# Patient Record
Sex: Female | Born: 1965
Health system: Southern US, Community
[De-identification: ages and names within clinical notes are randomized; demographics above are authoritative.]

## PROBLEM LIST (undated history)

## (undated) DIAGNOSIS — F419 Anxiety disorder, unspecified: Secondary | ICD-10-CM

## (undated) DIAGNOSIS — G43909 Migraine, unspecified, not intractable, without status migrainosus: Secondary | ICD-10-CM

## (undated) DIAGNOSIS — F32A Depression, unspecified: Secondary | ICD-10-CM

## (undated) DIAGNOSIS — F329 Major depressive disorder, single episode, unspecified: Secondary | ICD-10-CM

## (undated) HISTORY — DX: Migraine, unspecified, not intractable, without status migrainosus: G43.909

## (undated) HISTORY — PX: NO PAST SURGERIES: SHX2092

## (undated) HISTORY — DX: Major depressive disorder, single episode, unspecified: F32.9

## (undated) HISTORY — DX: Depression, unspecified: F32.A

## (undated) HISTORY — DX: Anxiety disorder, unspecified: F41.9

---

## 1998-02-11 ENCOUNTER — Ambulatory Visit (HOSPITAL_COMMUNITY): Admission: RE | Admit: 1998-02-11 | Discharge: 1998-02-11 | Payer: Self-pay | Admitting: Obstetrics and Gynecology

## 1998-03-24 ENCOUNTER — Inpatient Hospital Stay (HOSPITAL_COMMUNITY): Admission: AD | Admit: 1998-03-24 | Discharge: 1998-03-27 | Payer: Self-pay | Admitting: Obstetrics & Gynecology

## 1998-04-09 ENCOUNTER — Inpatient Hospital Stay (HOSPITAL_COMMUNITY): Admission: AD | Admit: 1998-04-09 | Discharge: 1998-04-12 | Payer: Self-pay | Admitting: Obstetrics and Gynecology

## 1998-05-21 ENCOUNTER — Ambulatory Visit (HOSPITAL_COMMUNITY): Admission: RE | Admit: 1998-05-21 | Discharge: 1998-05-21 | Payer: Self-pay | Admitting: Obstetrics and Gynecology

## 1998-06-03 ENCOUNTER — Other Ambulatory Visit: Admission: RE | Admit: 1998-06-03 | Discharge: 1998-06-03 | Payer: Self-pay | Admitting: Obstetrics and Gynecology

## 1999-08-04 ENCOUNTER — Other Ambulatory Visit: Admission: RE | Admit: 1999-08-04 | Discharge: 1999-08-04 | Payer: Self-pay | Admitting: Obstetrics and Gynecology

## 2010-09-11 ENCOUNTER — Encounter: Payer: Self-pay | Admitting: Obstetrics and Gynecology

## 2016-10-09 DIAGNOSIS — E559 Vitamin D deficiency, unspecified: Secondary | ICD-10-CM | POA: Diagnosis not present

## 2016-10-09 DIAGNOSIS — Z1231 Encounter for screening mammogram for malignant neoplasm of breast: Secondary | ICD-10-CM | POA: Diagnosis not present

## 2016-10-09 DIAGNOSIS — G43009 Migraine without aura, not intractable, without status migrainosus: Secondary | ICD-10-CM | POA: Diagnosis not present

## 2016-11-21 DIAGNOSIS — L659 Nonscarring hair loss, unspecified: Secondary | ICD-10-CM | POA: Diagnosis not present

## 2016-11-21 DIAGNOSIS — N951 Menopausal and female climacteric states: Secondary | ICD-10-CM | POA: Diagnosis not present

## 2016-11-21 DIAGNOSIS — R5383 Other fatigue: Secondary | ICD-10-CM | POA: Diagnosis not present

## 2017-02-16 DIAGNOSIS — Z79899 Other long term (current) drug therapy: Secondary | ICD-10-CM | POA: Diagnosis not present

## 2017-02-16 DIAGNOSIS — N183 Chronic kidney disease, stage 3 (moderate): Secondary | ICD-10-CM | POA: Diagnosis not present

## 2017-02-16 DIAGNOSIS — E559 Vitamin D deficiency, unspecified: Secondary | ICD-10-CM | POA: Diagnosis not present

## 2017-02-16 DIAGNOSIS — E7800 Pure hypercholesterolemia, unspecified: Secondary | ICD-10-CM | POA: Diagnosis not present

## 2017-02-16 DIAGNOSIS — G43009 Migraine without aura, not intractable, without status migrainosus: Secondary | ICD-10-CM | POA: Diagnosis not present

## 2017-07-20 DIAGNOSIS — R0602 Shortness of breath: Secondary | ICD-10-CM | POA: Diagnosis not present

## 2017-07-20 DIAGNOSIS — Z0000 Encounter for general adult medical examination without abnormal findings: Secondary | ICD-10-CM | POA: Diagnosis not present

## 2017-07-20 DIAGNOSIS — N183 Chronic kidney disease, stage 3 (moderate): Secondary | ICD-10-CM | POA: Diagnosis not present

## 2017-07-20 DIAGNOSIS — M129 Arthropathy, unspecified: Secondary | ICD-10-CM | POA: Diagnosis not present

## 2017-11-19 ENCOUNTER — Encounter: Payer: Self-pay | Admitting: Family Medicine

## 2017-11-19 ENCOUNTER — Telehealth: Payer: Self-pay | Admitting: Family Medicine

## 2017-11-19 ENCOUNTER — Ambulatory Visit (INDEPENDENT_AMBULATORY_CARE_PROVIDER_SITE_OTHER): Payer: 59 | Admitting: Family Medicine

## 2017-11-19 VITALS — BP 112/82 | HR 89 | Temp 98.3°F | Ht 67.0 in | Wt 206.2 lb

## 2017-11-19 DIAGNOSIS — G43009 Migraine without aura, not intractable, without status migrainosus: Secondary | ICD-10-CM | POA: Diagnosis not present

## 2017-11-19 MED ORDER — ATORVASTATIN CALCIUM 40 MG PO TABS: 40.0000 mg | ORAL_TABLET | Freq: Every day | ORAL | 3 refills | Status: DC

## 2017-11-19 MED ORDER — DIVALPROEX SODIUM ER 250 MG PO TB24: 250.0000 mg | ORAL_TABLET | Freq: Two times a day (BID) | ORAL | 3 refills | Status: DC

## 2017-11-19 MED ORDER — SUMATRIPTAN SUCCINATE 100 MG PO TABS: 100.0000 mg | ORAL_TABLET | ORAL | 5 refills | Status: DC | PRN

## 2017-11-19 NOTE — Patient Instructions (Addendum)
Let me know if you need refills.   Let us know if you need anything.  

## 2017-11-19 NOTE — Telephone Encounter (Signed)
Copied from CRM [phone]. Topic: Quick Communication - See Telephone Encounter >> Nov 19, 2017  3:38 PM Landry MellowFoltz, Melissa J wrote: CRM for notification. See Telephone encounter for: 11/19/17. Jane from pharmacy called  divalproex (DEPAKOTE ER) 250 MG 24 hr tablet - wants to make sure that is dosed correctly - the Er is usally dosed 1 time a day.  The DR is usually twice  a day  Cb nu is [phone]

## 2017-11-19 NOTE — Progress Notes (Signed)
Chief Complaint  Patient presents with  . Establish Care       New Patient Visit SUBJECTIVE: HPI: Cindy Conway is an 52 y.o.female who is being seen for establishing care.  The patient was previously seen at [facility].  Hx of migraines, currently on Depakote 250 mg bid. Has failed Topamax. Will use <10x/mo. Does not currently have a headache. Migraines affect R side. No aura. Associated symptoms include rare N/V.  Denies vision changes, light/sound sensitivity, balance issues, weakness, current headache.   Allergies  Allergen Reactions  . Sulfa Antibiotics Rash    Past Medical History:  Diagnosis Date  . Anxiety   . Depression   . Migraines    History reviewed. No pertinent surgical history. Social History   Socioeconomic History  . Marital status: Single   Family History  Problem Relation Age of Onset  . Heart disease Mother   . Lupus Mother   . Heart disease Father      Current Outpatient Medications:  .  atorvastatin (LIPITOR) 40 MG tablet, Take 1 tablet (40 mg total) by mouth daily., Disp: 90 tablet, Rfl: 3 .  divalproex (DEPAKOTE ER) 250 MG 24 hr tablet, Take 1 tablet (250 mg total) by mouth 2 (two) times daily., Disp: 180 tablet, Rfl: 3 .  FLUoxetine (PROZAC) 40 MG capsule, Take 40 mg by mouth daily., Disp: , Rfl:  .  gabapentin (NEURONTIN) 600 MG tablet, Take 600 mg by mouth 3 (three) times daily., Disp: , Rfl:  .  mirtazapine (REMERON) 15 MG tablet, Take 15 mg by mouth at bedtime as needed., Disp: , Rfl:  .  Multiple Vitamin (MULTIVITAMIN) tablet, Take 1 tablet by mouth daily., Disp: , Rfl:  .  pramipexole (MIRAPEX) 0.5 MG tablet, Take 0.5 mg by mouth 3 (three) times daily., Disp: , Rfl:  .  SUMAtriptan (IMITREX) 100 MG tablet, Take 1 tablet (100 mg total) by mouth every 2 (two) hours as needed for migraine. May repeat in 2 hours if headache persists or recurs., Disp: 10 tablet, Rfl: 5 .  traZODone (DESYREL) 50 MG tablet, Take 50 mg by mouth at bedtime as  needed for sleep., Disp: , Rfl:  .  Vitamin D, Ergocalciferol, (DRISDOL) 50000 units CAPS capsule, Take 50,000 Units by mouth every 7 (seven) days., Disp: , Rfl:   No LMP recorded.  ROS Cardiovascular: Denies chest pain  Respiratory: Denies dyspnea   OBJECTIVE: BP 112/82 (BP Location: Left Arm, Patient Position: Sitting, Cuff Size: Large)   Pulse 89   Temp 98.3 F (36.8 C) (Oral)   Ht 5\' 7"  (1.702 m)   Wt 206 lb 4 oz (93.6 kg)   SpO2 96%   BMI 32.30 kg/m   Constitutional: -  VS reviewed -  Well developed, well nourished, appears stated age -  No apparent distress  Psychiatric: -  Oriented to person, place, and time -  Memory intact -  Affect and mood normal -  Fluent conversation, good eye contact -  Judgment and insight age appropriate  Eye: -  Conjunctivae clear, no discharge -  Pupils symmetric, round, reactive to light  ENMT: -  MMM    Pharynx moist, no exudate, no erythema  Neck: -  No gross swelling, no palpable masses -  Thyroid midline, not enlarged, mobile, no palpable masses  Cardiovascular: -  RRR -  No LE edema  Respiratory: -  Normal respiratory effort, no accessory muscle use, no retraction -  Breath sounds equal, no wheezes, no ronchi,  no crackles  Gastrointestinal: -  Bowel sounds normal -  No tenderness, no distention, no guarding, no masses  Neurological:  -  CN II - XII grossly intact -  Sensation grossly intact to light touch, equal bilaterally  Musculoskeletal: -  No clubbing, no cyanosis -  Gait normal  Skin: -  No significant lesion on inspection -  Warm and dry to palpation   ASSESSMENT/PLAN: Migraine without aura and without status migrainosus, not intractable - Plan: SUMAtriptan (IMITREX) 100 MG tablet, divalproex (DEPAKOTE ER) 250 MG 24 hr tablet  Patient instructed to sign release of records form from her previous PCP. Cont Depakote, would like to see some relatively recent liver function testing. Will await records. Patient should  return in 6 mo for CPE. The patient voiced understanding and agreement to the plan.   Jilda Roche [place], DO 11/19/17  3:35 PM

## 2017-11-19 NOTE — Progress Notes (Signed)
Pre visit review using our clinic review tool, if applicable. No additional management support is needed unless otherwise documented below in the visit note. 

## 2017-11-20 NOTE — Telephone Encounter (Signed)
Patient called us back and she clarifies she has been on this medication and she takes it bid.

## 2017-11-21 ENCOUNTER — Other Ambulatory Visit: Payer: Self-pay | Admitting: Family Medicine

## 2017-11-21 ENCOUNTER — Encounter: Payer: Self-pay | Admitting: Family Medicine

## 2017-11-21 MED ORDER — DIVALPROEX SODIUM 250 MG PO DR TAB: 250.0000 mg | DELAYED_RELEASE_TABLET | Freq: Two times a day (BID) | ORAL | 1 refills | Status: DC

## 2017-11-21 NOTE — Telephone Encounter (Signed)
Is correct as she stated on the list.

## 2017-11-28 MED FILL — SUMATRIPTAN SUCC 100 MG TAB: 100 | 4 days supply | Qty: 9 | Fill #0

## 2017-11-28 MED FILL — ATORVASTATIN 40 MG TABLET: 40 | 30 days supply | Qty: 30 | Fill #0

## 2017-11-28 MED FILL — DIVALPROEX SOD DR 250 MG TA: 250 | 30 days supply | Qty: 60 | Fill #0

## 2017-12-28 ENCOUNTER — Encounter: Payer: Self-pay | Admitting: Family Medicine

## 2018-01-03 MED FILL — DIVALPROEX SOD DR 250 MG TA: 250 | 7 days supply | Qty: 15 | Fill #1

## 2018-01-03 MED FILL — ATORVASTATIN 40 MG TABLET: 40 | 30 days supply | Qty: 30 | Fill #1

## 2018-01-03 MED FILL — SUMATRIPTAN SUCC 100 MG TAB: 100 | 4 days supply | Qty: 9 | Fill #1

## 2018-01-04 MED FILL — ARIPiprazole 5 MG TABS: 5 | 30 days supply | Qty: 27 | Fill #0

## 2018-01-04 MED FILL — FLUoxetine HCL 40 MG CAPS: 40 | 30 days supply | Qty: 30 | Fill #0

## 2018-01-04 MED FILL — GABAPENTIN 600 MG TABS: 600 | 30 days supply | Qty: 90 | Fill #0

## 2018-01-04 MED FILL — PRAMIPEXOLE 1.5 MG TABLET: 1.5 | 30 days supply | Qty: 30 | Fill #0

## 2018-01-09 MED FILL — DIVALPROEX SOD DR 250 MG TA: 250 | 30 days supply | Qty: 60 | Fill #2

## 2018-01-22 MED FILL — SUMATRIPTAN SUCC 100 MG TAB: 100 | 5 days supply | Qty: 9 | Fill #2

## 2018-01-31 MED FILL — ATORVASTATIN 40 MG TABLET: 40 | 30 days supply | Qty: 30 | Fill #2

## 2018-01-31 MED FILL — FLUoxetine HCL 40 MG CAPS: 40 | 30 days supply | Qty: 30 | Fill #1

## 2018-01-31 MED FILL — PRAMIPEXOLE 1.5 MG TABLET: 1.5 | 30 days supply | Qty: 30 | Fill #1

## 2018-01-31 MED FILL — SUMATRIPTAN SUCC 100 MG TAB: 100 | 5 days supply | Qty: 9 | Fill #3

## 2018-01-31 MED FILL — GABAPENTIN 600 MG TAB: 600 | 30 days supply | Qty: 90 | Fill #1

## 2018-01-31 MED FILL — ARIPiprazole 5 MG TABS: 5 | 30 days supply | Qty: 30 | Fill #0

## 2018-02-01 MED FILL — DIVALPROEX SOD DR 250 MG TA: 250 | 30 days supply | Qty: 60 | Fill #3

## 2018-02-08 MED FILL — DIVALPROEX SOD ER 250 MG TA: 250 | 30 days supply | Qty: 60 | Fill #0

## 2018-02-08 MED FILL — SUMATRIPTAN SUCC 100 MG TAB: 100 | 5 days supply | Qty: 9 | Fill #4

## 2018-02-12 MED FILL — LITHIUM CARBONATE ER 300 MG: 300 | 31 days supply | Qty: 81 | Fill #0

## 2018-02-27 MED FILL — FLUoxetine HCL 40 MG CAPS: 40 | 30 days supply | Qty: 30 | Fill #0

## 2018-02-27 MED FILL — ARIPiprazole 5 MG TABS: 5 | 30 days supply | Qty: 30 | Fill #1

## 2018-02-27 MED FILL — PRAMIPEXOLE 1.5 MG TABLET: 1.5 | 30 days supply | Qty: 30 | Fill #0

## 2018-02-27 MED FILL — ATORVASTATIN 40 MG TABLET: 40 | 30 days supply | Qty: 30 | Fill #3

## 2018-02-27 MED FILL — DIVALPROEX SOD DR 250 MG TA: 250 | 5 days supply | Qty: 10 | Fill #4

## 2018-02-27 MED FILL — GABAPENTIN 600 MG TABS: 600 | 30 days supply | Qty: 90 | Fill #0

## 2018-03-08 MED FILL — SUMATRIPTAN SUCC 100 MG TAB: 100 | 5 days supply | Qty: 9 | Fill #5

## 2018-03-08 MED FILL — DIVALPROEX SOD DR 250 MG TA: 250 | 25 days supply | Qty: 50 | Fill #5

## 2018-03-25 MED FILL — lamoTRIgine 25 MG TABS: 25 | 30 days supply | Qty: 50 | Fill #0

## 2018-03-25 MED FILL — FLUoxetine HCL 40 MG CAPS: 40 | 30 days supply | Qty: 30 | Fill #0

## 2018-03-25 MED FILL — GABAPENTIN 600 MG TABS: 600 | 30 days supply | Qty: 90 | Fill #0

## 2018-03-25 MED FILL — PRAMIPEXOLE 1.5 MG TABLET: 1.5 | 30 days supply | Qty: 30 | Fill #0

## 2018-03-27 MED FILL — DIVALPROEX SOD DR 250 MG TA: 250 | 25 days supply | Qty: 50 | Fill #6

## 2018-03-27 MED FILL — SUMATRIPTAN SUCC 100 MG TAB: 100 | 3 days supply | Qty: 6 | Fill #6

## 2018-03-28 DIAGNOSIS — M722 Plantar fascial fibromatosis: Secondary | ICD-10-CM | POA: Diagnosis not present

## 2018-03-28 MED FILL — MELOXICAM 15 MG TABLET: 15 | 30 days supply | Qty: 30 | Fill #0

## 2018-04-01 ENCOUNTER — Other Ambulatory Visit: Payer: Self-pay | Admitting: Family Medicine

## 2018-04-01 DIAGNOSIS — G43009 Migraine without aura, not intractable, without status migrainosus: Secondary | ICD-10-CM

## 2018-04-02 MED FILL — SUMATRIPTAN SUCC 100 MG TAB: 100 | 30 days supply | Qty: 10 | Fill #0

## 2018-04-04 DIAGNOSIS — M722 Plantar fascial fibromatosis: Secondary | ICD-10-CM | POA: Diagnosis not present

## 2018-04-25 MED FILL — SUMATRIPTAN SUCC 100 MG TAB: 100 | 30 days supply | Qty: 10 | Fill #1

## 2018-04-29 MED FILL — PRAMIPEXOLE 1.5 MG TABLET: 1.5 | 30 days supply | Qty: 30 | Fill #1

## 2018-04-29 MED FILL — FLUoxetine HCL 40 MG CAPS: 40 | 30 days supply | Qty: 30 | Fill #1

## 2018-04-29 MED FILL — GABAPENTIN 600 MG TABS: 600 | 30 days supply | Qty: 90 | Fill #1

## 2018-05-02 MED FILL — DIVALPROEX SOD DR 250 MG TA: 250 | 25 days supply | Qty: 50 | Fill #7

## 2018-05-13 ENCOUNTER — Encounter: Payer: Self-pay | Admitting: Family Medicine

## 2018-05-21 MED FILL — MELOXICAM 15 MG TABLET: 15 | 30 days supply | Qty: 30 | Fill #1

## 2018-05-21 MED FILL — SUMATRIPTAN SUCC 100 MG TAB: 100 | 30 days supply | Qty: 10 | Fill #2

## 2018-05-22 MED FILL — FLUoxetine HCL 40 MG CAPS: 40 | 30 days supply | Qty: 30 | Fill #1

## 2018-05-22 MED FILL — PRAMIPEXOLE 1.5 MG TABLET: 1.5 | 30 days supply | Qty: 30 | Fill #1

## 2018-05-23 ENCOUNTER — Other Ambulatory Visit: Payer: Self-pay | Admitting: Family Medicine

## 2018-05-23 MED ORDER — DIVALPROEX SODIUM 250 MG PO DR TAB: 250.0000 mg | DELAYED_RELEASE_TABLET | Freq: Two times a day (BID) | ORAL | 1 refills | Status: DC

## 2018-05-23 MED FILL — DIVALPROEX SOD DR 250 MG TA: 250 | 30 days supply | Qty: 60 | Fill #0

## 2018-05-29 ENCOUNTER — Encounter: Payer: 59 | Admitting: Family Medicine

## 2018-06-17 MED FILL — SUMATRIPTAN SUCC 100 MG TAB: 100 | 30 days supply | Qty: 10 | Fill #3

## 2018-06-17 MED FILL — FLUoxetine HCL 40 MG CAPS: 40 | 30 days supply | Qty: 30 | Fill #0

## 2018-06-17 MED FILL — GABAPENTIN 600 MG TABS: 600 | 30 days supply | Qty: 90 | Fill #1

## 2018-06-17 MED FILL — PRAMIPEXOLE 1.5 MG TABLET: 1.5 | 30 days supply | Qty: 30 | Fill #2

## 2018-06-17 MED FILL — DIVALPROEX SOD DR 250 MG TA: 250 | 30 days supply | Qty: 60 | Fill #1

## 2018-06-24 ENCOUNTER — Ambulatory Visit (INDEPENDENT_AMBULATORY_CARE_PROVIDER_SITE_OTHER): Payer: 59 | Admitting: Family Medicine

## 2018-06-24 ENCOUNTER — Encounter: Payer: Self-pay | Admitting: Family Medicine

## 2018-06-24 VITALS — BP 108/72 | HR 99 | Temp 98.3°F | Ht 64.0 in | Wt 204.4 lb

## 2018-06-24 DIAGNOSIS — Z1239 Encounter for other screening for malignant neoplasm of breast: Secondary | ICD-10-CM | POA: Diagnosis not present

## 2018-06-24 DIAGNOSIS — Z114 Encounter for screening for human immunodeficiency virus [HIV]: Secondary | ICD-10-CM

## 2018-06-24 DIAGNOSIS — E559 Vitamin D deficiency, unspecified: Secondary | ICD-10-CM | POA: Diagnosis not present

## 2018-06-24 DIAGNOSIS — Z0000 Encounter for general adult medical examination without abnormal findings: Secondary | ICD-10-CM | POA: Diagnosis not present

## 2018-06-24 DIAGNOSIS — Z23 Encounter for immunization: Secondary | ICD-10-CM | POA: Diagnosis not present

## 2018-06-24 DIAGNOSIS — Z2821 Immunization not carried out because of patient refusal: Secondary | ICD-10-CM

## 2018-06-24 LAB — LIPID PANEL
Cholesterol: 252 mg/dL — ABNORMAL HIGH (ref 0–200)
HDL: 38.6 mg/dL — ABNORMAL LOW (ref >39.00)
NonHDL: 213.72
Total CHOL/HDL Ratio: 7
Triglycerides: 286 mg/dL — ABNORMAL HIGH (ref 0.0–149.0)
VLDL: 57.2 mg/dL — AB (ref 0.0–40.0)

## 2018-06-24 LAB — COMPREHENSIVE METABOLIC PANEL
ALK PHOS: 55 U/L (ref 39–117)
ALT: 15 U/L (ref 0–35)
AST: 21 U/L (ref 0–37)
Albumin: 4.4 g/dL (ref 3.5–5.2)
BILIRUBIN TOTAL: 0.3 mg/dL (ref 0.2–1.2)
BUN: 8 mg/dL (ref 6–23)
CALCIUM: 9.3 mg/dL (ref 8.4–10.5)
CO2: 26 meq/L (ref 19–32)
Chloride: 106 mEq/L (ref 96–112)
Creatinine, Ser: 0.73 mg/dL (ref 0.40–1.20)
GFR: 88.37 mL/min (ref >60.00)
Glucose, Bld: 95 mg/dL (ref 70–99)
POTASSIUM: 3.4 meq/L — AB (ref 3.5–5.1)
Sodium: 142 mEq/L (ref 135–145)
TOTAL PROTEIN: 7.3 g/dL (ref 6.0–8.3)

## 2018-06-24 LAB — LDL CHOLESTEROL, DIRECT: LDL DIRECT: 193 mg/dL

## 2018-06-24 LAB — VITAMIN D 25 HYDROXY (VIT D DEFICIENCY, FRACTURES): VITD: 26.48 ng/mL — AB (ref 30.00–100.00)

## 2018-06-24 MED ORDER — ATORVASTATIN CALCIUM 40 MG PO TABS: 40.0000 mg | ORAL_TABLET | Freq: Every day | ORAL | 3 refills | Status: DC

## 2018-06-24 MED ORDER — VITAMIN D (ERGOCALCIFEROL) 1.25 MG (50000 UNIT) PO CAPS: 50000.0000 [IU] | ORAL_CAPSULE | ORAL | 1 refills | Status: DC

## 2018-06-24 MED FILL — VIT D2 1.25 MG (50,000 UNIT: 1.25 MG | 28 days supply | Qty: 4 | Fill #0

## 2018-06-24 MED FILL — ATORVASTATIN 40 MG TABLET: 40 | 30 days supply | Qty: 30 | Fill #0

## 2018-06-24 NOTE — Progress Notes (Signed)
Chief Complaint  Patient presents with  . Annual Exam     Well Woman Cindy Conway is here for a complete physical.   Her last physical was >1 year ago.  Current diet: in general, diet could be better. Current exercise: none, active at work. Weight is stable. No LMP recorded. Seatbelt? Yes  Health Maintenance Pap/HPV- No Mammogram- No Tetanus- No HIV screening- No  Refuses flu shot.  Past Medical History:  Diagnosis Date  . Anxiety   . Depression   . Migraines     Past Surgical History:  Procedure Laterality Date  . NO PAST SURGERIES     Medications  Current Outpatient Medications on File Prior to Visit  Medication Sig Dispense Refill  . divalproex (DEPAKOTE) 250 MG DR tablet Take 1 tablet (250 mg total) by mouth 2 (two) times daily. 180 tablet 1  . FLUoxetine (PROZAC) 40 MG capsule Take 40 mg by mouth daily.    Marland Kitchen gabapentin (NEURONTIN) 600 MG tablet Take 600 mg by mouth 3 (three) times daily.    . meloxicam (MOBIC) 15 MG tablet Take 15 mg by mouth daily.    . Multiple Vitamin (MULTIVITAMIN) tablet Take 1 tablet by mouth daily.    . pramipexole (MIRAPEX) 0.5 MG tablet Take 0.5 mg by mouth daily.     . SUMAtriptan (IMITREX) 100 MG tablet TAKE 1 TABLET BY MOUTH EVERY TWO HOURS AS NEEDED FOR MIGRAINE. MAY REPEAT ONCE IN 2 HOURS IF HEADACHE PERSITS OR RECURS 10 tablet 5  . mirtazapine (REMERON) 15 MG tablet Take 15 mg by mouth at bedtime as needed.    . traZODone (DESYREL) 50 MG tablet Take 50 mg by mouth at bedtime as needed for sleep.     Allergies Allergies  Allergen Reactions  . Sulfa Antibiotics Rash    Review of Systems: Constitutional:  no unexpected weight changes Eye:  no recent significant change in vision Ear/Nose/Mouth/Throat:  Ears:  no tinnitus or vertigo and no recent change in hearing Nose/Mouth/Throat:  no complaints of nasal congestion, no sore throat Cardiovascular: no chest pain Respiratory:  no cough and no shortness of  breath Gastrointestinal:  no abdominal pain, no change in bowel habits GU:  Female: negative for dysuria or pelvic pain Musculoskeletal/Extremities:  no pain of the joints Integumentary (Skin/Breast):  no abnormal skin lesions reported Neurologic:  no headaches Endocrine:  denies fatigue Hematologic/Lymphatic:  No areas of easy bleeding  Exam BP 108/72 (BP Location: Left Arm, Patient Position: Sitting, Cuff Size: Normal)   Pulse 99   Temp 98.3 F (36.8 C) (Oral)   Ht 5\' 4"  (1.626 m)   Wt 204 lb 6 oz (92.7 kg)   SpO2 98%   BMI 35.08 kg/m  General:  well developed, well nourished, in no apparent distress Skin:  no significant moles, warts, or growths Head:  no masses, lesions, or tenderness Eyes:  pupils equal and round, sclera anicteric without injection Ears:  canals without lesions, TMs shiny without retraction, no obvious effusion, no erythema Nose:  nares patent, septum midline, mucosa normal, and no drainage or sinus tenderness Throat/Pharynx:  lips and gingiva without lesion; tongue and uvula midline; non-inflamed pharynx; no exudates or postnasal drainage Neck: neck supple without adenopathy, thyromegaly, or masses Lungs:  clear to auscultation, breath sounds equal bilaterally, no respiratory distress Cardio:  regular rate and rhythm, no bruits, no LE edema Abdomen:  abdomen soft, nontender; bowel sounds normal; no masses or organomegaly Genital: Defer to GYN Musculoskeletal:  symmetrical muscle  groups noted without atrophy or deformity Extremities:  no clubbing, cyanosis, or edema, no deformities, no skin discoloration Neuro:  gait normal; deep tendon reflexes normal and symmetric Psych: well oriented with normal range of affect and appropriate judgment/insight  Assessment and Plan  Well adult exam - Plan: Comprehensive metabolic panel, Lipid panel  Encounter for screening for HIV - Plan: HIV Antibody (routine testing w rflx)  Screening for breast cancer - Plan: MM  DIGITAL SCREENING BILATERAL  Vitamin D deficiency - Plan: Vitamin D (25 hydroxy)  Need for tetanus booster - Plan: Tdap vaccine greater than or equal to 7yo IM  Refused influenza vaccine   Well 52 y.o. female. Counseled on diet and exercise. Needs to exercise more and clean up diet.  GYN info provided.  Declines colonoscopy at this time, will let us know if she changes her mind. Would prefer this over Cologard.  Other orders as above. Follow up 6 mo for med ck. The patient voiced understanding and agreement to the plan.  Jilda Roche [place], DO 06/24/18 1:47 PM

## 2018-06-24 NOTE — Progress Notes (Signed)
Pre visit review using our clinic review tool, if applicable. No additional management support is needed unless otherwise documented below in the visit note. 

## 2018-06-24 NOTE — Patient Instructions (Addendum)
Give [place] 2-3 business days to get the results of your labs back.   Call Center for [facility] at [facility] at [phone] for an appointment.  They are located at [address], [address], [place], [place], [address] (right across the hall from our office).  The new Shingrix vaccine (for shingles) is a 2 shot series. It can make people feel low energy, achy and almost like they have the flu for 48 hours after injection. Please plan accordingly when deciding on when to get this shot. Call our office for a nurse visit appointment to get this. The second shot of the series is less severe regarding the side effects, but it still lasts 48 hours.   Aim to do some physical exertion for 150 minutes per week. This is typically divided into 5 days per week, 30 minutes per day. The activity should be enough to get your heart rate up. Anything is better than nothing if you have time constraints. Consider lifting weights to help upper body.  Let us know if you need anything.

## 2018-06-25 ENCOUNTER — Other Ambulatory Visit: Payer: Self-pay | Admitting: Family Medicine

## 2018-06-25 DIAGNOSIS — E559 Vitamin D deficiency, unspecified: Secondary | ICD-10-CM

## 2018-06-25 DIAGNOSIS — E876 Hypokalemia: Secondary | ICD-10-CM

## 2018-06-25 DIAGNOSIS — E782 Mixed hyperlipidemia: Secondary | ICD-10-CM

## 2018-06-25 LAB — HIV ANTIBODY (ROUTINE TESTING W REFLEX): HIV 1&2 Ab, 4th Generation: NONREACTIVE

## 2018-06-26 ENCOUNTER — Encounter (HOSPITAL_BASED_OUTPATIENT_CLINIC_OR_DEPARTMENT_OTHER): Payer: Self-pay

## 2018-06-26 ENCOUNTER — Ambulatory Visit (HOSPITAL_BASED_OUTPATIENT_CLINIC_OR_DEPARTMENT_OTHER)
Admission: RE | Admit: 2018-06-26 | Discharge: 2018-06-26 | Disposition: A | Discharge status: Home / Self Care | Payer: 59 | Source: Ambulatory Visit | Attending: Family Medicine | Admitting: Family Medicine

## 2018-06-26 ENCOUNTER — Inpatient Hospital Stay (HOSPITAL_BASED_OUTPATIENT_CLINIC_OR_DEPARTMENT_OTHER): Admission: RE | Admit: 2018-06-26 | Payer: 59 | Source: Ambulatory Visit

## 2018-06-26 DIAGNOSIS — Z1239 Encounter for other screening for malignant neoplasm of breast: Secondary | ICD-10-CM | POA: Insufficient documentation

## 2018-06-26 DIAGNOSIS — Z1231 Encounter for screening mammogram for malignant neoplasm of breast: Secondary | ICD-10-CM | POA: Diagnosis not present

## 2018-06-28 ENCOUNTER — Other Ambulatory Visit: Payer: Self-pay | Admitting: Family Medicine

## 2018-06-28 DIAGNOSIS — R921 Mammographic calcification found on diagnostic imaging of breast: Secondary | ICD-10-CM

## 2018-07-01 ENCOUNTER — Telehealth: Payer: Self-pay | Admitting: *Deleted

## 2018-07-01 NOTE — Telephone Encounter (Signed)
Received Physician Orders from [facility]; forwarded to provider/SLS 11/11

## 2018-07-02 ENCOUNTER — Other Ambulatory Visit: Payer: Self-pay

## 2018-07-02 ENCOUNTER — Other Ambulatory Visit (INDEPENDENT_AMBULATORY_CARE_PROVIDER_SITE_OTHER): Payer: 59

## 2018-07-02 ENCOUNTER — Telehealth: Payer: Self-pay

## 2018-07-02 ENCOUNTER — Other Ambulatory Visit: Payer: Self-pay | Admitting: Family Medicine

## 2018-07-02 DIAGNOSIS — R921 Mammographic calcification found on diagnostic imaging of breast: Secondary | ICD-10-CM

## 2018-07-02 DIAGNOSIS — E559 Vitamin D deficiency, unspecified: Secondary | ICD-10-CM

## 2018-07-02 DIAGNOSIS — E782 Mixed hyperlipidemia: Secondary | ICD-10-CM

## 2018-07-02 DIAGNOSIS — E876 Hypokalemia: Secondary | ICD-10-CM | POA: Diagnosis not present

## 2018-07-02 LAB — BASIC METABOLIC PANEL
BUN: 11 mg/dL (ref 6–23)
CALCIUM: 9.2 mg/dL (ref 8.4–10.5)
CO2: 25 mEq/L (ref 19–32)
CREATININE: 0.71 mg/dL (ref 0.40–1.20)
Chloride: 105 mEq/L (ref 96–112)
GFR: 91.24 mL/min (ref >60.00)
Glucose, Bld: 95 mg/dL (ref 70–99)
Potassium: 3.4 mEq/L — ABNORMAL LOW (ref 3.5–5.1)
Sodium: 141 mEq/L (ref 135–145)

## 2018-07-02 LAB — VITAMIN D 25 HYDROXY (VIT D DEFICIENCY, FRACTURES): VITD: 23.79 ng/mL — AB (ref 30.00–100.00)

## 2018-07-02 LAB — LDL CHOLESTEROL, DIRECT: LDL DIRECT: 212 mg/dL

## 2018-07-02 LAB — LIPID PANEL
Cholesterol: 269 mg/dL — ABNORMAL HIGH (ref 0–200)
HDL: 38.4 mg/dL — AB (ref >39.00)
NONHDL: 230.92
Total CHOL/HDL Ratio: 7
Triglycerides: 216 mg/dL — ABNORMAL HIGH (ref 0.0–149.0)
VLDL: 43.2 mg/dL — AB (ref 0.0–40.0)

## 2018-07-02 NOTE — Telephone Encounter (Signed)
Copied from CRM [id number]. Topic: Referral - Status >> Jul 02, 2018  2:02 PM Crist Infante wrote: Reason for CRM: [facility]kshire [facility] in order for diagnostic mammogram. Pt is scheduled tomorrow. Dr Can sign off in epic, or return the fax today please.

## 2018-07-03 ENCOUNTER — Other Ambulatory Visit: Payer: Self-pay | Admitting: Family Medicine

## 2018-07-03 MED ORDER — POTASSIUM CHLORIDE CRYS ER 10 MEQ PO TBCR: 10.0000 meq | EXTENDED_RELEASE_TABLET | Freq: Two times a day (BID) | ORAL | 0 refills | Status: DC

## 2018-07-03 MED FILL — POTASSIUM CHL ER M10 TABLET: 10 | 7 days supply | Qty: 14 | Fill #0

## 2018-07-03 NOTE — Telephone Encounter (Signed)
Received paperwork///faxed yesterday 07/02/2018

## 2018-07-04 NOTE — Telephone Encounter (Signed)
Orders faxed back to [facility] on 07/03/18 Am/SLS

## 2018-07-08 ENCOUNTER — Ambulatory Visit
Admission: RE | Admit: 2018-07-08 | Discharge: 2018-07-08 | Disposition: A | Discharge status: Home / Self Care | Payer: 59 | Source: Ambulatory Visit | Attending: Family Medicine | Admitting: Family Medicine

## 2018-07-08 DIAGNOSIS — R921 Mammographic calcification found on diagnostic imaging of breast: Secondary | ICD-10-CM

## 2018-07-08 DIAGNOSIS — R922 Inconclusive mammogram: Secondary | ICD-10-CM | POA: Diagnosis not present

## 2018-07-11 MED FILL — FLUoxetine HCL 40 MG CAPS: 40 | 30 days supply | Qty: 30 | Fill #1

## 2018-07-11 MED FILL — PRAMIPEXOLE 1.5 MG TABLET: 1.5 | 30 days supply | Qty: 30 | Fill #3

## 2018-07-11 MED FILL — SUMATRIPTAN SUCC 100 MG TAB: 100 | 30 days supply | Qty: 10 | Fill #4

## 2018-07-16 ENCOUNTER — Other Ambulatory Visit: Payer: 59

## 2018-07-16 MED FILL — GABAPENTIN 600 MG TABS: 600 | 30 days supply | Qty: 90 | Fill #0

## 2018-07-16 MED FILL — VIT D2 1.25 MG (50,000 UNIT: 1.25 MG | 28 days supply | Qty: 4 | Fill #1

## 2018-07-16 MED FILL — DIVALPROEX SOD DR 250 MG TA: 250 | 30 days supply | Qty: 60 | Fill #2

## 2018-08-01 MED FILL — ATORVASTATIN 40 MG TABLET: 40 | 30 days supply | Qty: 30 | Fill #1

## 2018-08-05 MED FILL — SUMATRIPTAN SUCC 100 MG TAB: 100 | 30 days supply | Qty: 10 | Fill #5

## 2018-08-15 MED FILL — PRAMIPEXOLE 1.5 MG TABLET: 1.5 | 30 days supply | Qty: 30 | Fill #4

## 2018-08-15 MED FILL — DIVALPROEX SOD DR 250 MG TA: 250 | 30 days supply | Qty: 60 | Fill #3

## 2018-08-15 MED FILL — FLUoxetine HCL 40 MG CAPS: 40 | 30 days supply | Qty: 30 | Fill #2

## 2018-08-15 MED FILL — VIT D2 1.25 MG (50,000 UNIT: 1.25 MG | 28 days supply | Qty: 4 | Fill #2

## 2018-08-15 MED FILL — GABAPENTIN 600 MG TABLET: 600 | 30 days supply | Qty: 90 | Fill #1

## 2018-08-23 ENCOUNTER — Other Ambulatory Visit: Payer: Self-pay | Admitting: Family Medicine

## 2018-08-23 DIAGNOSIS — G43009 Migraine without aura, not intractable, without status migrainosus: Secondary | ICD-10-CM

## 2018-08-23 DIAGNOSIS — Z719 Counseling, unspecified: Secondary | ICD-10-CM | POA: Diagnosis not present

## 2018-08-23 MED ORDER — SUMATRIPTAN SUCCINATE 100 MG PO TABS: ORAL_TABLET | ORAL | 0 refills | Status: DC

## 2018-08-25 ENCOUNTER — Encounter: Payer: Self-pay | Admitting: Family Medicine

## 2018-08-25 DIAGNOSIS — G43009 Migraine without aura, not intractable, without status migrainosus: Secondary | ICD-10-CM

## 2018-08-26 ENCOUNTER — Ambulatory Visit (INDEPENDENT_AMBULATORY_CARE_PROVIDER_SITE_OTHER): Payer: 59 | Admitting: Family Medicine

## 2018-08-26 ENCOUNTER — Encounter: Payer: Self-pay | Admitting: Family Medicine

## 2018-08-26 VITALS — BP 110/78 | HR 89 | Temp 98.1°F | Ht 67.0 in | Wt 204.0 lb

## 2018-08-26 DIAGNOSIS — G43009 Migraine without aura, not intractable, without status migrainosus: Secondary | ICD-10-CM

## 2018-08-26 MED ORDER — SUMATRIPTAN SUCCINATE 100 MG PO TABS: 100.0000 mg | ORAL_TABLET | ORAL | 0 refills | Status: DC | PRN

## 2018-08-26 MED ORDER — DIVALPROEX SODIUM ER 500 MG PO TB24: 500.0000 mg | ORAL_TABLET | Freq: Every day | ORAL | 3 refills | Status: DC

## 2018-08-26 MED ORDER — SUMATRIPTAN SUCCINATE 100 MG PO TABS: ORAL_TABLET | ORAL | 0 refills | Status: DC

## 2018-08-26 MED FILL — DIVALPROEX SOD ER 500 MG TA: 500 | 30 days supply | Qty: 30 | Fill #0

## 2018-08-26 NOTE — Patient Instructions (Signed)
This new medicine is $12 at [employer] assuming no insurance coverage.  Consider magnesium 400-500 mg daily to help decrease headaches.   Let us know if you need anything.

## 2018-08-26 NOTE — Progress Notes (Signed)
Chief Complaint  Patient presents with  . Migraine    Discuss changing migraine medication    Cindy Conway is a 53 y.o. female here for evaluation of right-sided headache.  Patient has history of migraines.  Right-sided headache that can last for several days.  Imitrex is very helpful.  She does have a stressful lifestyle.  She takes Depakote 250 mg twice daily and it has been helpful.  Lately, she has been refilling the Imitrex early.  No numbness, tingling, weakness, or vision changes.  Past Medical History:  Diagnosis Date  . Anxiety   . Depression   . Migraines    BP 110/78 (BP Location: Left Arm, Patient Position: Sitting, Cuff Size: Large)   Pulse 89   Temp 98.1 F (36.7 C) (Oral)   Ht 5\' 7"  (1.702 m)   Wt 204 lb (92.5 kg)   SpO2 94%   BMI 31.95 kg/m  General: awake, alert, appearing stated age Eyes: PERRLA, EOMi Lungs: no accessory muscle use Neuro: CN 2-12 intact, no cerebellar signs, DTR's equal and symmetry, no clonus MSK: 5/5 strength throughout, normal gait, no TTP over posterior cervical triangle or paraspinal cervical musculature Psych: Age appropriate judgment and insight, mood and affect normal  Migraine without aura and without status migrainosus, not intractable - Plan: divalproex (DEPAKOTE ER) 500 MG 24 hr tablet, SUMAtriptan (IMITREX) 100 MG tablet  Increase dose of Depakote, refill sumatriptan. Follow up in 3 mo. The patient voiced understanding and agreement to the plan.  Jilda Roche [place], [place] 3:18 PM 08/26/18

## 2018-08-26 NOTE — Progress Notes (Signed)
Pre visit review using our clinic review tool, if applicable. No additional management support is needed unless otherwise documented below in the visit note. 

## 2018-09-02 DIAGNOSIS — Z719 Counseling, unspecified: Secondary | ICD-10-CM | POA: Diagnosis not present

## 2018-09-02 MED FILL — MELOXICAM 15 MG TABLET: 15 | 30 days supply | Qty: 30 | Fill #0

## 2018-09-03 DIAGNOSIS — Z719 Counseling, unspecified: Secondary | ICD-10-CM | POA: Diagnosis not present

## 2018-09-12 DIAGNOSIS — Z719 Counseling, unspecified: Secondary | ICD-10-CM | POA: Diagnosis not present

## 2018-09-17 MED FILL — PRAMIPEXOLE 1.5 MG TABLET: 1.5 | 30 days supply | Qty: 30 | Fill #5

## 2018-09-17 MED FILL — FLUoxetine HCL 40 MG CAPS: 40 | 30 days supply | Qty: 30 | Fill #3

## 2018-09-17 MED FILL — GABAPENTIN 600 MG TABS: 600 | 30 days supply | Qty: 90 | Fill #2

## 2018-09-17 MED FILL — ATORVASTATIN 40 MG TABLET: 40 | 30 days supply | Qty: 30 | Fill #2

## 2018-09-17 MED FILL — VIT D2 1.25 MG (50,000 UNIT: 1.25 MG | 28 days supply | Qty: 4 | Fill #3

## 2018-09-23 MED FILL — DIVALPROEX SOD DR 250 MG TA: 250 | 30 days supply | Qty: 60 | Fill #4

## 2018-09-27 MED FILL — SUMAtriptan SUCCINATE 100 M: 100 | 30 days supply | Qty: 10 | Fill #0

## 2018-09-30 MED FILL — VIT D2 1.25 MG (50,000 UNIT: 1.25 MG | 28 days supply | Qty: 4 | Fill #3

## 2018-09-30 MED FILL — FLUoxetine HCL 40 MG CAPS: 40 | 30 days supply | Qty: 30 | Fill #3

## 2018-09-30 MED FILL — GABAPENTIN 600 MG TABS: 600 | 30 days supply | Qty: 90 | Fill #2

## 2018-09-30 MED FILL — DIVALPROEX SOD DR 250 MG TA: 250 | 30 days supply | Qty: 60 | Fill #4

## 2018-09-30 MED FILL — ATORVASTATIN 40 MG TABLET: 40 | 30 days supply | Qty: 30 | Fill #2

## 2018-09-30 MED FILL — PRAMIPEXOLE 1.5 MG TABLET: 1.5 | 30 days supply | Qty: 30 | Fill #5

## 2018-10-03 MED FILL — MELOXICAM 15 MG TABLET: 15 | 30 days supply | Qty: 30 | Fill #1

## 2018-10-23 MED FILL — SUMAtriptan SUCCINATE 100 M: 100 | 30 days supply | Qty: 10 | Fill #1

## 2018-10-23 MED FILL — GABAPENTIN 600 MG TABS: 600 | 30 days supply | Qty: 90 | Fill #0

## 2018-10-23 MED FILL — FLUoxetine HCL 40 MG CAPS: 40 | 30 days supply | Qty: 30 | Fill #0

## 2018-10-23 MED FILL — PRAMIPEXOLE 1.5 MG TABLET: 1.5 | 30 days supply | Qty: 30 | Fill #0

## 2018-10-23 MED FILL — DIVALPROEX SOD ER 500 MG TA: 500 | 30 days supply | Qty: 30 | Fill #1

## 2018-10-23 MED FILL — VIT D2 1.25 MG (50,000 UNIT: 1.25 MG | 28 days supply | Qty: 4 | Fill #4 | Status: TO

## 2018-10-28 MED FILL — ATORVASTATIN 40 MG TABLET: 40 | 30 days supply | Qty: 30 | Fill #3 | Status: TO

## 2018-11-01 MED FILL — DIVALPROEX SOD DR 250 MG TA: 250 | 30 days supply | Qty: 60 | Fill #5

## 2018-11-12 ENCOUNTER — Encounter: Payer: Self-pay | Admitting: Family Medicine

## 2018-11-12 MED FILL — PRAMIPEXOLE 1.5 MG TABLET: 1.5 | 30 days supply | Qty: 30 | Fill #0

## 2018-11-12 MED FILL — ATORVASTATIN 40 MG TABLET: 40 | 30 days supply | Qty: 30 | Fill #4

## 2018-11-12 MED FILL — SUMATRIPTAN SUCC 100 MG TAB: 100 | 30 days supply | Qty: 10 | Fill #2

## 2018-11-12 MED FILL — GABAPENTIN 600 MG TABS: 600 | 30 days supply | Qty: 90 | Fill #3

## 2018-11-13 ENCOUNTER — Other Ambulatory Visit: Payer: Self-pay | Admitting: Family Medicine

## 2018-11-13 MED ORDER — MELOXICAM 15 MG PO TABS: 15.0000 mg | ORAL_TABLET | Freq: Every day | ORAL | 0 refills | Status: DC

## 2018-11-13 MED FILL — MELOXICAM 15 MG TABLET: 15 | 30 days supply | Qty: 30 | Fill #0

## 2018-11-19 MED FILL — FLUoxetine HCL 40 MG CAPS: 40 | 30 days supply | Qty: 30 | Fill #0

## 2018-11-25 ENCOUNTER — Ambulatory Visit: Payer: 59 | Admitting: Family Medicine

## 2018-12-09 MED FILL — PRAMIPEXOLE 1.5 MG TABLET: 1.5 | 30 days supply | Qty: 30 | Fill #1

## 2018-12-09 MED FILL — SUMATRIPTAN SUCC 100 MG TAB: 100 | 30 days supply | Qty: 10 | Fill #3

## 2018-12-23 ENCOUNTER — Ambulatory Visit: Payer: 59 | Admitting: Family Medicine

## 2018-12-24 ENCOUNTER — Ambulatory Visit (INDEPENDENT_AMBULATORY_CARE_PROVIDER_SITE_OTHER): Payer: 59 | Admitting: Family Medicine

## 2018-12-24 ENCOUNTER — Encounter: Payer: Self-pay | Admitting: Family Medicine

## 2018-12-24 ENCOUNTER — Other Ambulatory Visit: Payer: Self-pay | Admitting: Family Medicine

## 2018-12-24 ENCOUNTER — Other Ambulatory Visit: Payer: Self-pay

## 2018-12-24 DIAGNOSIS — G43009 Migraine without aura, not intractable, without status migrainosus: Secondary | ICD-10-CM

## 2018-12-24 DIAGNOSIS — M79671 Pain in right foot: Secondary | ICD-10-CM | POA: Diagnosis not present

## 2018-12-24 DIAGNOSIS — M79672 Pain in left foot: Secondary | ICD-10-CM | POA: Diagnosis not present

## 2018-12-24 DIAGNOSIS — E782 Mixed hyperlipidemia: Secondary | ICD-10-CM | POA: Insufficient documentation

## 2018-12-24 MED ORDER — DIVALPROEX SODIUM ER 500 MG PO TB24: 500.0000 mg | ORAL_TABLET | Freq: Every day | ORAL | 6 refills | Status: DC

## 2018-12-24 MED ORDER — SUMATRIPTAN SUCCINATE 100 MG PO TABS: 100.0000 mg | ORAL_TABLET | ORAL | 2 refills | Status: DC | PRN

## 2018-12-24 MED ORDER — MELOXICAM 15 MG PO TABS: 15.0000 mg | ORAL_TABLET | Freq: Every day | ORAL | 5 refills | Status: DC

## 2018-12-24 NOTE — Progress Notes (Signed)
Chief Complaint  Patient presents with  . Medication Refill    Subjective: Hyperlipidemia Patient presents for Hyperlipidemia follow up. Due to COVID-19 pandemic, we are interacting via web portal for an electronic face-to-face visit. I verified patient's ID using 2 identifiers. Patient agreed to proceed with visit via this method. Patient is at work, I am at home. Patient and I are present for visit.   Currently taking Lipitor 40 mg/d and compliance with treatment thus far has been good. She denies myalgias. She is adhering to a healthy diet. Exercise: walking  Hx of migraines. Reports good control. Imitrex has been helpful. No current headache  Hx of b/l foot pain. Currently taking meloxicam 15 mg/d for it, which is very helpful. She received the rx from a podiatrist. Unsure what the etiology of her pain is. Hurts worse when she walks.   ROS: Heart: Denies chest pain or palpitations Lungs: Denies SOB or cough  Past Medical History:  Diagnosis Date  . Anxiety   . Depression   . Migraines     Objective: No conversational dyspnea Age appropriate judgment and insight Nml affect and mood  Assessment and Plan: Bilateral foot pain - Plan: meloxicam (MOBIC) 15 MG tablet  Migraine without aura and without status migrainosus, not intractable - Plan: divalproex (DEPAKOTE ER) 500 MG 24 hr tablet, SUMAtriptan (IMITREX) 100 MG tablet  Mixed hyperlipidemia  1- cont NSAID, I would like to evaluate her at some point rather than just have her take NSAIDs for this. 2- Cont Depakote and Imitrex prn. 3- Cont Lipitor. Ck labs. F/u at earliest convenience in person to discuss foot issue. The patient voiced understanding and agreement to the plan.  Jilda Roche [place], DO 12/24/18  2:25 PM

## 2018-12-25 ENCOUNTER — Ambulatory Visit: Payer: 59 | Admitting: Family Medicine

## 2018-12-25 ENCOUNTER — Telehealth: Payer: Self-pay | Admitting: Family Medicine

## 2018-12-25 ENCOUNTER — Encounter: Payer: Self-pay | Admitting: Family Medicine

## 2018-12-25 NOTE — Telephone Encounter (Signed)
Copied from CRM [id number]. Topic: General - Other >> Dec 25, 2018  7:19 AM Tamela Oddi wrote: Reason for CRM: Patient called to cancel appt. For today.  Patient stated she will call back at another time to reschedule.  CB# [id number]  appt canceled

## 2018-12-27 ENCOUNTER — Ambulatory Visit: Payer: 59 | Admitting: Family Medicine

## 2019-01-30 ENCOUNTER — Other Ambulatory Visit: Payer: Self-pay | Admitting: Family Medicine

## 2019-01-30 MED ORDER — VITAMIN D (ERGOCALCIFEROL) 1.25 MG (50000 UNIT) PO CAPS: 50000.0000 [IU] | ORAL_CAPSULE | ORAL | 1 refills | Status: DC

## 2019-03-03 ENCOUNTER — Other Ambulatory Visit: Payer: Self-pay | Admitting: Family Medicine

## 2019-03-03 DIAGNOSIS — G43009 Migraine without aura, not intractable, without status migrainosus: Secondary | ICD-10-CM

## 2019-03-22 ENCOUNTER — Other Ambulatory Visit: Payer: Self-pay | Admitting: Family Medicine

## 2019-03-22 DIAGNOSIS — G43009 Migraine without aura, not intractable, without status migrainosus: Secondary | ICD-10-CM

## 2019-04-07 ENCOUNTER — Other Ambulatory Visit: Payer: Self-pay

## 2019-04-09 ENCOUNTER — Ambulatory Visit: Payer: 59 | Admitting: Family Medicine

## 2019-04-10 ENCOUNTER — Other Ambulatory Visit: Payer: Self-pay

## 2019-04-11 ENCOUNTER — Ambulatory Visit (INDEPENDENT_AMBULATORY_CARE_PROVIDER_SITE_OTHER): Payer: 59 | Admitting: Family Medicine

## 2019-04-11 ENCOUNTER — Encounter: Payer: Self-pay | Admitting: Family Medicine

## 2019-04-11 VITALS — BP 102/68 | HR 82 | Temp 95.6°F | Wt 194.0 lb

## 2019-04-11 DIAGNOSIS — G43009 Migraine without aura, not intractable, without status migrainosus: Secondary | ICD-10-CM | POA: Diagnosis not present

## 2019-04-11 DIAGNOSIS — M79671 Pain in right foot: Secondary | ICD-10-CM | POA: Diagnosis not present

## 2019-04-11 DIAGNOSIS — E782 Mixed hyperlipidemia: Secondary | ICD-10-CM

## 2019-04-11 DIAGNOSIS — M79672 Pain in left foot: Secondary | ICD-10-CM | POA: Diagnosis not present

## 2019-04-11 MED ORDER — DIVALPROEX SODIUM ER 500 MG PO TB24: 500.0000 mg | ORAL_TABLET | Freq: Every day | ORAL | 6 refills | Status: DC

## 2019-04-11 MED ORDER — ATORVASTATIN CALCIUM 40 MG PO TABS: 40.0000 mg | ORAL_TABLET | Freq: Every day | ORAL | 6 refills | Status: DC

## 2019-04-11 MED ORDER — SUMATRIPTAN SUCCINATE 100 MG PO TABS: 100.0000 mg | ORAL_TABLET | ORAL | 5 refills | Status: DC

## 2019-04-11 MED ORDER — MELOXICAM 15 MG PO TABS: 15.0000 mg | ORAL_TABLET | Freq: Every day | ORAL | 5 refills | Status: DC

## 2019-04-11 NOTE — Progress Notes (Signed)
Chief Complaint  Patient presents with  . Medication Problem    Subjective: Hyperlipidemia Patient presents for Hyperlipidemia follow up. Currently taking Lipitor 40 mg/d and compliance with treatment thus far has been good. She denies myalgias. She is adhering to a healthy diet. Exercise: some walking The patient is not known to have coexisting coronary artery disease.  +hx of migraines. On Depakote 500 mg XR daily and Imitrex prn. Uses latter around 1x every 2 weeks. No AE's with either. Reports compliance. No current HA.   ROS: Heart: Denies chest pain Neuro: No HA  Past Medical History:  Diagnosis Date  . Anxiety   . Depression   . Migraines     Objective: BP 102/68 (BP Location: Left Arm, Patient Position: Sitting, Cuff Size: Normal)   Pulse 82   Temp (!) 95.6 F (35.3 C) (Temporal)   Wt 194 lb (88 kg)   SpO2 97%   BMI 30.38 kg/m  General: Awake, appears stated age HEENT: MMM Heart: RRR, no LE edema, no bruits Lungs: CTAB, no rales, wheezes or rhonchi. No accessory muscle use Neuro: DTR's equal and symmetric throughout, no clonus, no cerebellar signs Psych: Age appropriate judgment and insight, normal affect and mood  Assessment and Plan: Mixed hyperlipidemia  Migraine without aura and without status migrainosus, not intractable - Plan: SUMAtriptan (IMITREX) 100 MG tablet, divalproex (DEPAKOTE ER) 500 MG 24 hr tablet  Bilateral foot pain - Plan: meloxicam (MOBIC) 15 MG tablet  1- cont statin 2- cont Depakote, Imitrex prn 3- cont Mobic, [place] sock rec'd, stretches/exercises F/u in 3 mo for CPE or prn. The patient voiced understanding and agreement to the plan.  [place], DO 04/11/19  2:40 PM

## 2019-04-11 NOTE — Patient Instructions (Signed)
Consider a [place] sock to wear at night.   Plantar Fasciitis Stretches/exercises Do exercises exactly as told by your health care provider and adjust them as directed. It is normal to feel mild stretching, pulling, tightness, or discomfort as you do these exercises, but you should stop right away if you feel sudden pain or your pain gets worse.   Stretching and range of motion exercises These exercises warm up your muscles and joints and improve the movement and flexibility of your foot. These exercises also help to relieve pain.  Exercise A: Plantar fascia stretch 1. Sit with your left / right leg crossed over your opposite knee. 2. Hold your heel with one hand with that thumb near your arch. With your other hand, hold your toes and gently pull them back toward the top of your foot. You should feel a stretch on the bottom of your toes or your foot or both. 3. Hold this stretch for 30 seconds. 4. Slowly release your toes and return to the starting position. Repeat 2 times. Complete this exercise 3 times per week.  Exercise B: Gastroc, standing 1. Stand with your hands against a wall. 2. Extend your left / right leg behind you, and bend your front knee slightly. 3. Keeping your heels on the floor and keeping your back knee straight, shift your weight toward the wall without arching your back. You should feel a gentle stretch in your left / right calf. 4. Hold this position for 30 seconds. Repeat 2 times. Complete this exercise 3 times a week. Exercise C: Soleus, standing 1. Stand with your hands against a wall. 2. Extend your left / right leg behind you, and bend your front knee slightly. 3. Keeping your heels on the floor, bend your back knee and slightly shift your weight over the back leg. You should feel a gentle stretch deep in your calf. 4. Hold this position for 30 seconds. Repeat 2 times. Complete this exercise 3 times per week. Exercise D: Gastrocsoleus, standing 1. Stand  with the ball of your left / right foot on a step. The ball of your foot is on the walking surface, right under your toes. 2. Keep your other foot firmly on the same step. 3. Hold onto the wall or a railing for balance. 4. Slowly lift your other foot, allowing your body weight to press your heel down over the edge of the step. You should feel a stretch in your left / right calf. 5. Hold this position for 30 seconds. 6. Return both feet to the step. 7. Repeat this exercise with a slight bend in your left / right knee. Repeat 2 times with your left / right knee straight and 2times with your left / right knee bent. Complete this exercise 3 times a week.  Balance exercise This exercise builds your balance and strength control of your arch to help take pressure off your plantar fascia. Exercise E: Single leg stand 1. Without shoes, stand near a railing or in a doorway. You may hold onto the railing or door frame as needed. 2. Stand on your left / right foot. Keep your big toe down on the floor and try to keep your arch lifted. Do not let your foot roll inward. 3. Hold this position for 30 seconds. 4. If this exercise is too easy, you can try it with your eyes closed or while standing on a pillow. Repeat 2 times. Complete this exercise 3 times per week. This information is not  intended to replace advice given to you by your health care provider. Make sure you discuss any questions you have with your health care provider. Document Released: 08/07/2005 Document Revised: 04/11/2016 Document Reviewed: 06/21/2015 Elsevier Interactive Patient Education  2017 [employer].

## 2019-07-11 ENCOUNTER — Encounter: Payer: 59 | Admitting: Family Medicine

## 2019-07-16 ENCOUNTER — Encounter: Payer: 59 | Admitting: Family Medicine

## 2019-07-16 ENCOUNTER — Encounter: Payer: Self-pay | Admitting: Family Medicine

## 2019-08-26 ENCOUNTER — Encounter: Payer: Self-pay | Admitting: Family Medicine

## 2019-08-27 ENCOUNTER — Other Ambulatory Visit: Payer: Self-pay

## 2019-08-27 ENCOUNTER — Encounter: Payer: Self-pay | Admitting: Family Medicine

## 2019-08-27 ENCOUNTER — Ambulatory Visit (INDEPENDENT_AMBULATORY_CARE_PROVIDER_SITE_OTHER): Payer: 59 | Admitting: Family Medicine

## 2019-08-27 DIAGNOSIS — E782 Mixed hyperlipidemia: Secondary | ICD-10-CM | POA: Diagnosis not present

## 2019-08-27 DIAGNOSIS — M79671 Pain in right foot: Secondary | ICD-10-CM | POA: Diagnosis not present

## 2019-08-27 DIAGNOSIS — M79672 Pain in left foot: Secondary | ICD-10-CM

## 2019-08-27 DIAGNOSIS — G43009 Migraine without aura, not intractable, without status migrainosus: Secondary | ICD-10-CM

## 2019-08-27 DIAGNOSIS — Z1211 Encounter for screening for malignant neoplasm of colon: Secondary | ICD-10-CM

## 2019-08-27 DIAGNOSIS — Z0001 Encounter for general adult medical examination with abnormal findings: Secondary | ICD-10-CM

## 2019-08-27 DIAGNOSIS — Z0000 Encounter for general adult medical examination without abnormal findings: Secondary | ICD-10-CM | POA: Diagnosis not present

## 2019-08-27 DIAGNOSIS — E559 Vitamin D deficiency, unspecified: Secondary | ICD-10-CM

## 2019-08-27 MED ORDER — DIVALPROEX SODIUM ER 500 MG PO TB24: 500.0000 mg | ORAL_TABLET | Freq: Every day | ORAL | 6 refills | Status: DC

## 2019-08-27 MED ORDER — MELOXICAM 15 MG PO TABS: 15.0000 mg | ORAL_TABLET | Freq: Every day | ORAL | 5 refills | Status: DC

## 2019-08-27 MED ORDER — ATORVASTATIN CALCIUM 40 MG PO TABS: 40.0000 mg | ORAL_TABLET | Freq: Every day | ORAL | 6 refills | Status: DC

## 2019-08-27 MED ORDER — SUMATRIPTAN SUCCINATE 100 MG PO TABS: 100.0000 mg | ORAL_TABLET | ORAL | 5 refills | Status: DC

## 2019-08-27 NOTE — Progress Notes (Signed)
Chief Complaint  Patient presents with  . Annual Exam     Well Woman Cindy Conway is here for a complete physical.  Due to COVID-19 pandemic, we are interacting via web portal for an electronic face-to-face visit. I verified patient's ID using 2 identifiers. Patient agreed to proceed with visit via this method. Patient is at home, I am at office. Patient and I are present for visit.   Her last physical was >1 year ago.  Current diet: in general, a "healthy" diet. Current exercise: walking, active at work. Weight is stable and she denies daytime fatigue. No LMP recorded. Patient is postmenopausal.  Seatbelt? Yes  Health Maintenance Pap/HPV- No Mammogram- Yes Colon cancer screening-No Shingrix- No Tetanus- Yes HIV screening- Yes  Past Medical History:  Diagnosis Date  . Anxiety   . Depression   . Migraines      Past Surgical History:  Procedure Laterality Date  . NO PAST SURGERIES      Medications  Current Outpatient Medications on File Prior to Visit  Medication Sig Dispense Refill  . FLUoxetine (PROZAC) 40 MG capsule Take 40 mg by mouth daily.    Marland Kitchen gabapentin (NEURONTIN) 600 MG tablet Take 600 mg by mouth 3 (three) times daily.    . Multiple Vitamin (MULTIVITAMIN) tablet Take 1 tablet by mouth daily.    . pramipexole (MIRAPEX) 0.5 MG tablet Take 0.5 mg by mouth daily.      Allergies Allergies  Allergen Reactions  . Sulfa Antibiotics Rash    Review of Systems: Constitutional:  no unexpected weight changes Eye:  no recent significant change in vision Ear/Nose/Mouth/Throat:  Ears:  no recent change in hearing Nose/Mouth/Throat:  no complaints of nasal congestion, no sore throat Cardiovascular: no chest pain Respiratory:  no shortness of breath Gastrointestinal:  no abdominal pain, no change in bowel habits GU:  Female: negative for dysuria or pelvic pain Musculoskeletal/Extremities: +continued b/l foot pain (controlled w Mobic); otherwise no pain of the  joints Integumentary (Skin/Breast):  no abnormal skin lesions reported Neurologic:  no headaches Endocrine:  denies fatigue Hematologic/Lymphatic:  No areas of easy bleeding  Exam No conversational dyspnea Age appropriate judgment and insight Nml affect and mood  Assessment and Plan  Well adult exam - Plan: CBC, Lipid Profile, Comp Met (CMET)  Migraine without aura and without status migrainosus, not intractable - Plan: divalproex (DEPAKOTE ER) 500 MG 24 hr tablet, SUMAtriptan (IMITREX) 100 MG tablet  Bilateral foot pain - Plan: meloxicam (MOBIC) 15 MG tablet  Screen for colon cancer - Plan: Ambulatory referral to Gastroenterology  Vitamin D deficiency - Plan: Vitamin D (25 hydroxy)   Well 54 y.o. female. Counseled on diet and exercise. Other orders as above. Needs to do exercises for feet. Will consider PT vs injections vs podiatry referral if no improvement. Ideally would not want her taking Mobic every day for rest of life to deal with this.  GYN info given. GI referral for [facility].  Follow up in 6 mo, labs at convenience. The patient voiced understanding and agreement to the plan.  [place], DO 08/27/19 9:00 AM

## 2019-08-27 NOTE — Addendum Note (Signed)
Addended by: Harley Alto on: 08/27/2019 02:36 PM   Modules accepted: Orders

## 2019-08-28 LAB — COMPREHENSIVE METABOLIC PANEL
ALT: 13 U/L (ref 0–35)
AST: 14 U/L (ref 0–37)
Albumin: 4.3 g/dL (ref 3.5–5.2)
Alkaline Phosphatase: 49 U/L (ref 39–117)
BUN: 14 mg/dL (ref 6–23)
CO2: 27 mEq/L (ref 19–32)
Calcium: 9.2 mg/dL (ref 8.4–10.5)
Chloride: 104 mEq/L (ref 96–112)
Creatinine, Ser: 0.67 mg/dL (ref 0.40–1.20)
GFR: 91.4 mL/min (ref >60.00)
Glucose, Bld: 85 mg/dL (ref 70–99)
Potassium: 3.7 mEq/L (ref 3.5–5.1)
Sodium: 140 mEq/L (ref 135–145)
Total Bilirubin: 0.6 mg/dL (ref 0.2–1.2)
Total Protein: 7 g/dL (ref 6.0–8.3)

## 2019-08-28 LAB — LIPID PANEL
Cholesterol: 154 mg/dL (ref 0–200)
HDL: 45 mg/dL (ref >39.00)
LDL Cholesterol: 93 mg/dL (ref 0–99)
NonHDL: 109.06
Total CHOL/HDL Ratio: 3
Triglycerides: 80 mg/dL (ref 0.0–149.0)
VLDL: 16 mg/dL (ref 0.0–40.0)

## 2019-08-28 LAB — HEPATIC FUNCTION PANEL
ALT: 13 U/L (ref 0–35)
AST: 14 U/L (ref 0–37)
Albumin: 4.3 g/dL (ref 3.5–5.2)
Alkaline Phosphatase: 49 U/L (ref 39–117)
Bilirubin, Direct: 0.1 mg/dL (ref 0.0–0.3)
Total Bilirubin: 0.6 mg/dL (ref 0.2–1.2)
Total Protein: 7 g/dL (ref 6.0–8.3)

## 2019-08-28 LAB — CBC
HCT: 39.6 % (ref 36.0–46.0)
Hemoglobin: 13.6 g/dL (ref 12.0–15.0)
MCHC: 34.2 g/dL (ref 30.0–36.0)
MCV: 88 fl (ref 78.0–100.0)
Platelets: 185 10*3/uL (ref 150.0–400.0)
RBC: 4.5 Mil/uL (ref 3.87–5.11)
RDW: 12.8 % (ref 11.5–15.5)
WBC: 6.8 10*3/uL (ref 4.0–10.5)

## 2019-08-28 LAB — VITAMIN D 25 HYDROXY (VIT D DEFICIENCY, FRACTURES): VITD: 46.57 ng/mL (ref 30.00–100.00)

## 2019-09-08 ENCOUNTER — Other Ambulatory Visit: Payer: Self-pay | Admitting: Family Medicine

## 2019-09-08 MED ORDER — VITAMIN D (ERGOCALCIFEROL) 1.25 MG (50000 UNIT) PO CAPS: 50000.0000 [IU] | ORAL_CAPSULE | ORAL | 1 refills | Status: DC

## 2019-10-02 ENCOUNTER — Encounter: Payer: 59 | Admitting: Obstetrics & Gynecology

## 2019-11-25 MED ORDER — FLUOXETINE HCL 40 MG PO CAPS: 40.0000 mg | ORAL_CAPSULE | Freq: Every day | ORAL | 0 refills | Status: DC

## 2019-12-05 ENCOUNTER — Other Ambulatory Visit: Payer: Self-pay | Admitting: Family Medicine

## 2019-12-05 DIAGNOSIS — G43009 Migraine without aura, not intractable, without status migrainosus: Secondary | ICD-10-CM

## 2019-12-05 MED ORDER — SUMATRIPTAN SUCCINATE 100 MG PO TABS: 100.0000 mg | ORAL_TABLET | ORAL | 5 refills | Status: DC

## 2020-03-08 IMAGING — MG DIGITAL SCREENING BILATERAL MAMMOGRAM WITH TOMO AND CAD
6 of 10 series · 6 of 30 positions shown · non-contrast
Comparison: Previous exam(s).

CLINICAL DATA: Screening.

EXAM:
DIGITAL SCREENING BILATERAL MAMMOGRAM WITH TOMO AND CAD

[L CC synth-2D]
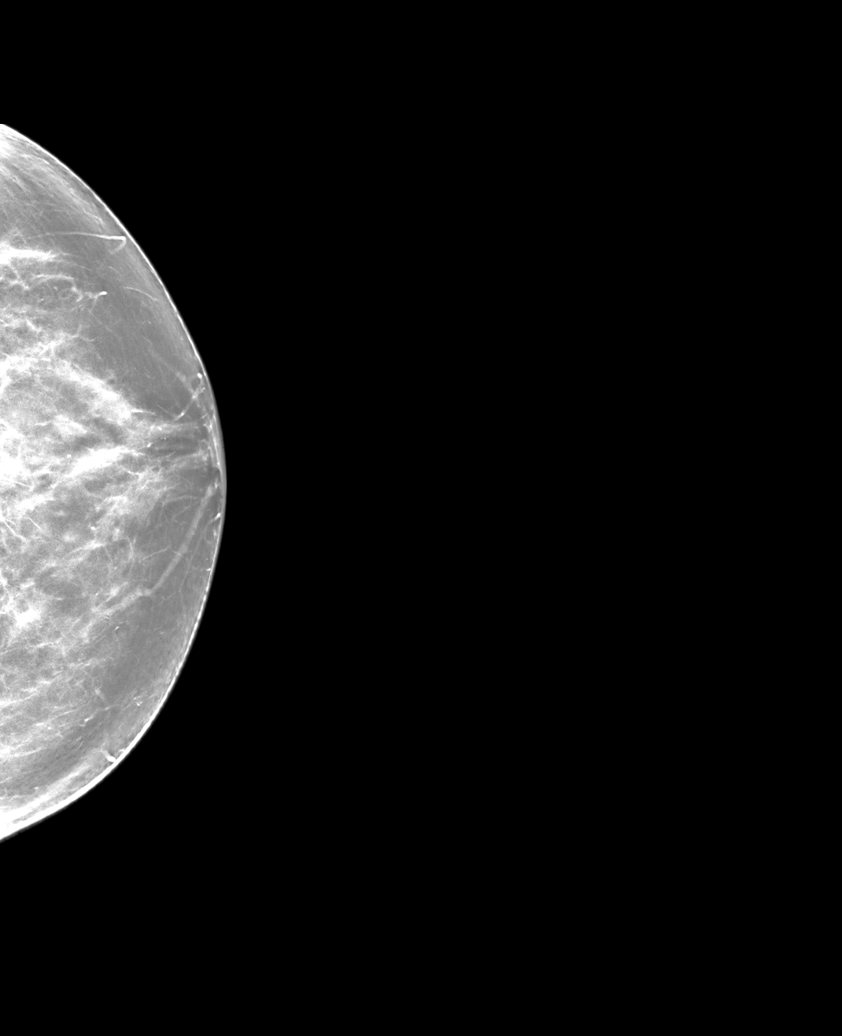

[R MLO synth-2D]
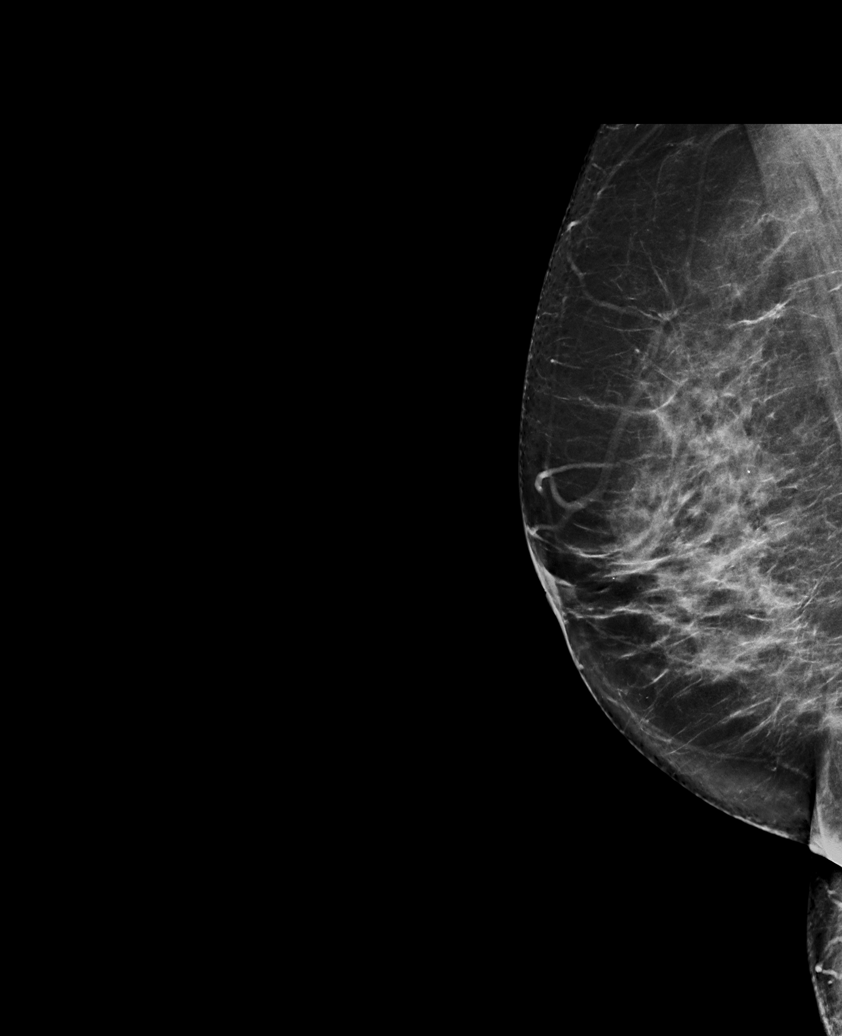

[R CC synth-2D]
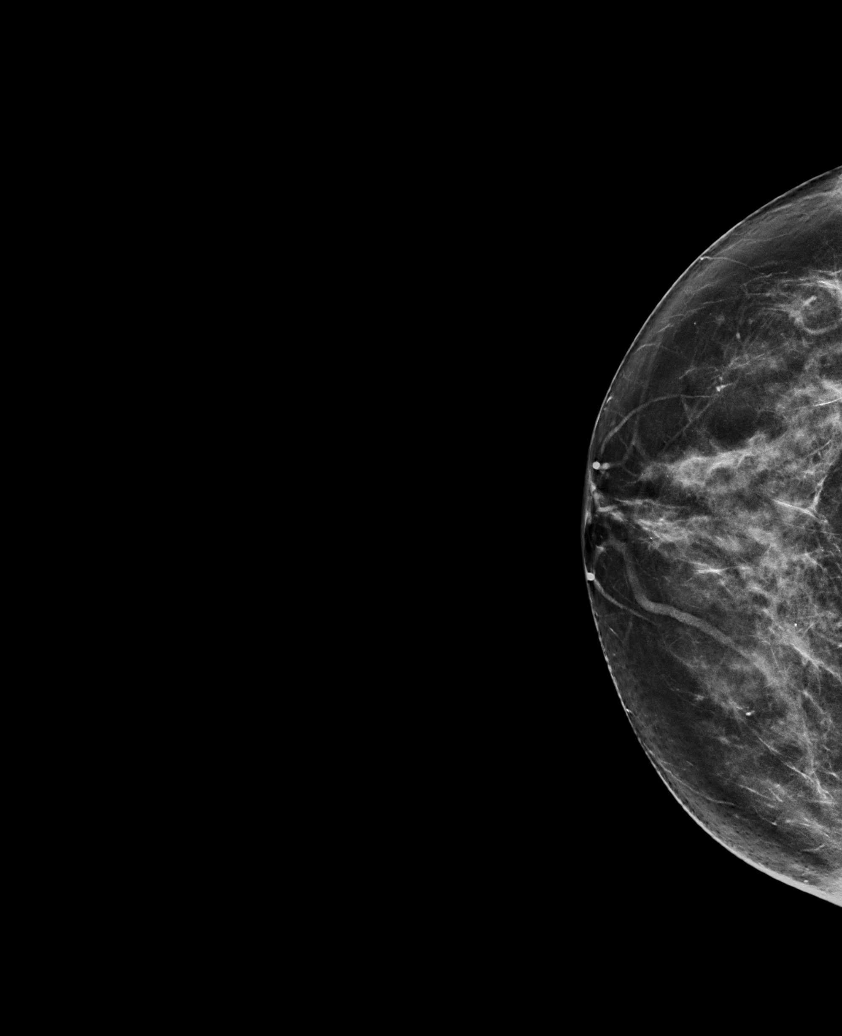

[L MLO synth-2D]
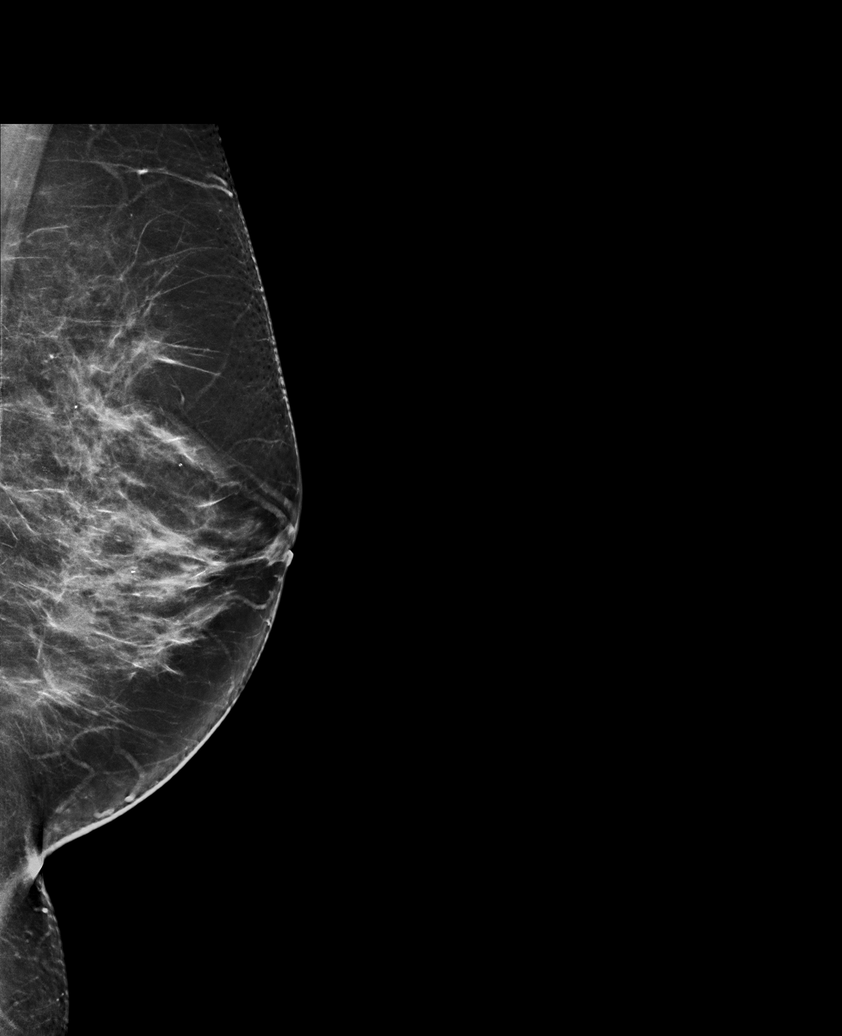

[L XCCL synth-2D]
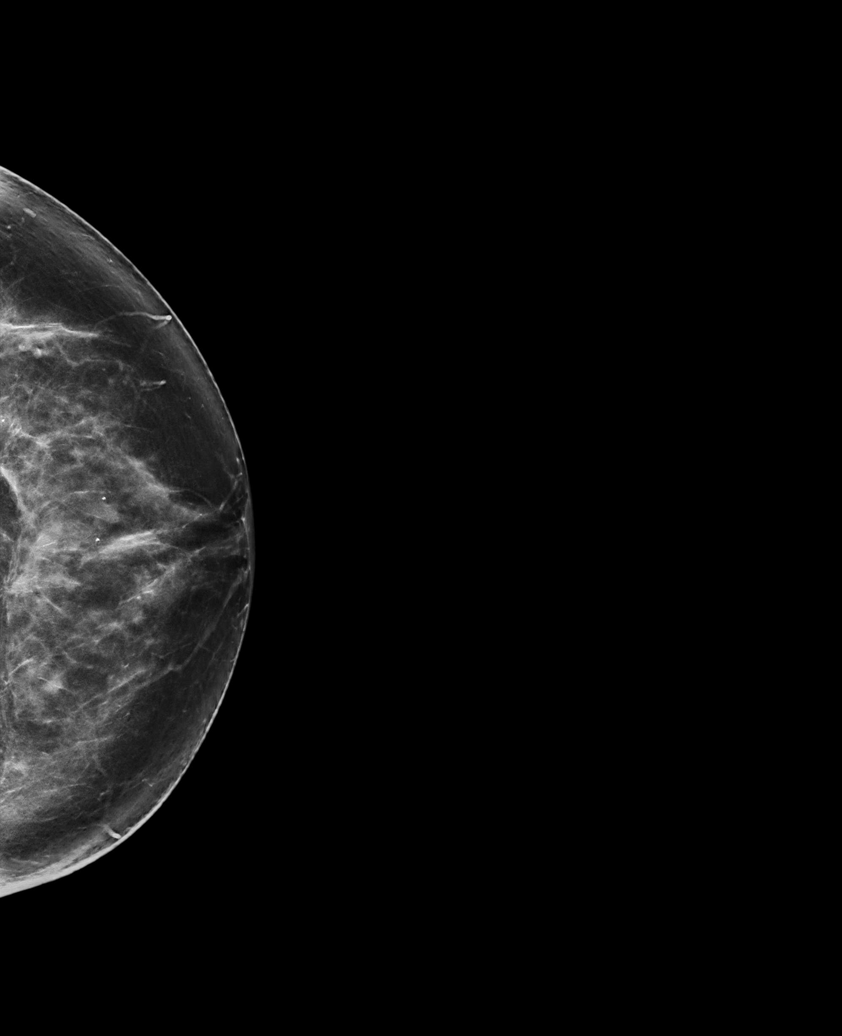

[R MLO tomo · tomo slice 39/76.0]
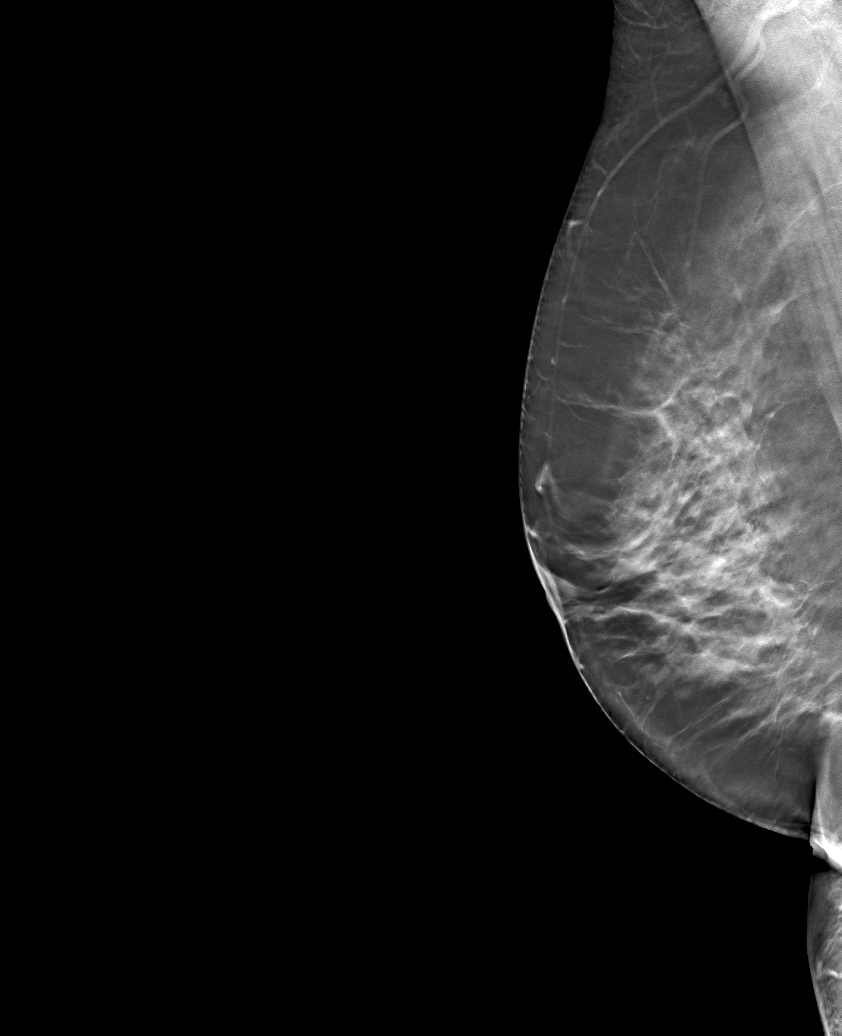

[6 of 30 positions shown; findings below may reference images not displayed]

ACR Breast Density Category c: The breast tissue is heterogeneously
dense, which may obscure small masses.
FINDINGS: In the left breast, calcifications warrant further evaluation. In
the right breast, no findings suspicious for malignancy. Images were
processed with CAD.
IMPRESSION: Further evaluation is suggested for calcifications in the left
breast.

RECOMMENDATION:
Diagnostic mammogram of the left breast. (Code:[id number])

The patient will be contacted regarding the findings, and additional
imaging will be scheduled.

BI-RADS CATEGORY  0: Incomplete. Need additional imaging evaluation
and/or prior mammograms for comparison.

## 2020-03-26 ENCOUNTER — Other Ambulatory Visit: Payer: Self-pay

## 2020-03-26 ENCOUNTER — Other Ambulatory Visit: Payer: Self-pay | Admitting: Family Medicine

## 2020-03-26 MED ORDER — ATORVASTATIN CALCIUM 40 MG PO TABS: 40.0000 mg | ORAL_TABLET | Freq: Every day | ORAL | 2 refills | Status: DC

## 2020-03-26 MED FILL — ATORVASTATIN CALCIUM 40 MG: 40 | 30 days supply | Qty: 30 | Fill #0

## 2020-04-01 ENCOUNTER — Other Ambulatory Visit: Payer: Self-pay | Admitting: Family Medicine

## 2020-04-01 DIAGNOSIS — G43009 Migraine without aura, not intractable, without status migrainosus: Secondary | ICD-10-CM

## 2020-04-11 ENCOUNTER — Other Ambulatory Visit: Payer: Self-pay | Admitting: Family Medicine

## 2020-04-11 DIAGNOSIS — M79671 Pain in right foot: Secondary | ICD-10-CM

## 2020-04-12 ENCOUNTER — Other Ambulatory Visit: Payer: Self-pay | Admitting: Family Medicine

## 2020-04-23 ENCOUNTER — Ambulatory Visit: Payer: 59 | Admitting: Family Medicine

## 2020-04-30 ENCOUNTER — Ambulatory Visit: Payer: 59 | Admitting: Family Medicine

## 2020-05-20 DIAGNOSIS — G43009 Migraine without aura, not intractable, without status migrainosus: Secondary | ICD-10-CM

## 2020-05-20 MED ORDER — DIVALPROEX SODIUM ER 500 MG PO TB24: 500.0000 mg | ORAL_TABLET | Freq: Every day | ORAL | 6 refills | Status: DC

## 2020-05-26 ENCOUNTER — Ambulatory Visit: Payer: 59 | Admitting: Family Medicine

## 2020-05-28 ENCOUNTER — Ambulatory Visit: Payer: 59 | Admitting: Family Medicine

## 2020-06-01 ENCOUNTER — Encounter: Payer: Self-pay | Admitting: Family Medicine

## 2020-06-01 ENCOUNTER — Other Ambulatory Visit: Payer: Self-pay

## 2020-06-01 ENCOUNTER — Telehealth (INDEPENDENT_AMBULATORY_CARE_PROVIDER_SITE_OTHER): Payer: 59 | Admitting: Family Medicine

## 2020-06-01 DIAGNOSIS — F411 Generalized anxiety disorder: Secondary | ICD-10-CM | POA: Insufficient documentation

## 2020-06-01 DIAGNOSIS — E782 Mixed hyperlipidemia: Secondary | ICD-10-CM | POA: Diagnosis not present

## 2020-06-01 DIAGNOSIS — F325 Major depressive disorder, single episode, in full remission: Secondary | ICD-10-CM

## 2020-06-01 DIAGNOSIS — G43009 Migraine without aura, not intractable, without status migrainosus: Secondary | ICD-10-CM

## 2020-06-01 MED ORDER — DIVALPROEX SODIUM ER 500 MG PO TB24: 500.0000 mg | ORAL_TABLET | Freq: Every day | ORAL | 9 refills | Status: DC

## 2020-06-01 MED ORDER — FLUOXETINE HCL 40 MG PO CAPS: 40.0000 mg | ORAL_CAPSULE | Freq: Every day | ORAL | 9 refills | Status: DC

## 2020-06-01 MED ORDER — ATORVASTATIN CALCIUM 40 MG PO TABS: ORAL_TABLET | ORAL | 9 refills | Status: DC

## 2020-06-01 NOTE — Progress Notes (Signed)
Chief Complaint  Patient presents with  . Follow-up    medication    Subjective Cindy Conway presents for f/u anxiety/depression. Due to COVID-19 pandemic, we are interacting via web portal for an electronic face-to-face visit. I verified patient's ID using 2 identifiers. Patient agreed to proceed with visit via this method. Patient is at home, I am at office. Patient and I are present for visit.   Pt is currently being treated with Prozac 40 mg/d.  Reports doing well since treatment. No thoughts of harming self or others. No self-medication with alcohol, prescription drugs or illicit drugs. Pt is not following with a counselor/psychologist.  Hyperlipidemia Patient presents for dyslipidemia follow up. Currently being treated with Lipitor 40 mg/d and compliance with treatment thus far has been good. She denies myalgias. She is sometimes adhering to a healthy diet. Exercise: active at home, stairs The patient is not known to have coexisting coronary artery disease.  Migraines Takes Depakote 500 mg/d. No AE's, compliant. Imitrex for abortive therapy. Working well.   Past Medical History:  Diagnosis Date  . Anxiety   . Depression   . Migraines    Allergies as of 06/01/2020      Reactions   Sulfa Antibiotics Rash      Medication List       Accurate as of June 01, 2020  2:15 PM. If you have any questions, ask your nurse or doctor.        STOP taking these medications   Vitamin D (Ergocalciferol) 1.25 MG (50000 UNIT) Caps capsule Commonly known as: DRISDOL Stopped by: Sharlene Dory, DO     TAKE these medications   atorvastatin 40 MG tablet Commonly known as: LIPITOR TAKE 1 TABLET(40 MG) BY MOUTH DAILY   divalproex 500 MG 24 hr tablet Commonly known as: DEPAKOTE ER Take 1 tablet (500 mg total) by mouth daily.   FLUoxetine 40 MG capsule Commonly known as: PROZAC Take 1 capsule (40 mg total) by mouth daily.   gabapentin 600 MG tablet Commonly known  as: NEURONTIN Take 600 mg by mouth 3 (three) times daily.   meloxicam 15 MG tablet Commonly known as: MOBIC TAKE 1 TABLET(15 MG) BY MOUTH DAILY   multivitamin tablet Take 1 tablet by mouth daily.   pramipexole 0.5 MG tablet Commonly known as: MIRAPEX Take 0.5 mg by mouth daily.   SUMAtriptan 100 MG tablet Commonly known as: IMITREX TAKE 1 TABLET BY MOUTH. MAY REPEAT IN 2 HOURS IF HEADACHE PERSISTS       Exam No conversational dyspnea Age appropriate judgment and insight Nml affect and mood  Assessment and Plan  Depression, major, single episode, complete remission (HCC) - Plan: FLUoxetine (PROZAC) 40 MG capsule  GAD (generalized anxiety disorder) - Plan: FLUoxetine (PROZAC) 40 MG capsule  Mixed hyperlipidemia - Plan: atorvastatin (LIPITOR) 40 MG tablet  Migraine without aura and without status migrainosus, not intractable - Plan: divalproex (DEPAKOTE ER) 500 MG 24 hr tablet  1/2- Cont Prozac 40 mg/d. 3. Counseled on diet/exercise. Cont Lipitor 40 mg/d 4. Cont Depakote 500 mg/d. Cont Imitrex.  F/u in 6 mo for CPE. The patient voiced understanding and agreement to the plan.  Jilda Roche [place], DO 06/01/20 2:15 PM

## 2020-09-15 ENCOUNTER — Other Ambulatory Visit: Payer: Self-pay | Admitting: Family Medicine

## 2020-09-15 DIAGNOSIS — G43009 Migraine without aura, not intractable, without status migrainosus: Secondary | ICD-10-CM

## 2020-09-29 ENCOUNTER — Ambulatory Visit: Payer: 59 | Admitting: Family Medicine

## 2020-10-01 ENCOUNTER — Ambulatory Visit: Payer: 59 | Admitting: Family Medicine

## 2020-10-15 ENCOUNTER — Encounter: Payer: Self-pay | Admitting: Family Medicine

## 2020-10-15 ENCOUNTER — Ambulatory Visit (INDEPENDENT_AMBULATORY_CARE_PROVIDER_SITE_OTHER): Payer: 59 | Admitting: Family Medicine

## 2020-10-15 ENCOUNTER — Other Ambulatory Visit: Payer: Self-pay

## 2020-10-15 VITALS — BP 108/68 | HR 79 | Temp 97.9°F | Ht 67.0 in | Wt 174.0 lb

## 2020-10-15 DIAGNOSIS — F325 Major depressive disorder, single episode, in full remission: Secondary | ICD-10-CM | POA: Diagnosis not present

## 2020-10-15 DIAGNOSIS — M79672 Pain in left foot: Secondary | ICD-10-CM

## 2020-10-15 DIAGNOSIS — G43009 Migraine without aura, not intractable, without status migrainosus: Secondary | ICD-10-CM

## 2020-10-15 DIAGNOSIS — E782 Mixed hyperlipidemia: Secondary | ICD-10-CM | POA: Diagnosis not present

## 2020-10-15 DIAGNOSIS — F411 Generalized anxiety disorder: Secondary | ICD-10-CM | POA: Diagnosis not present

## 2020-10-15 DIAGNOSIS — M79671 Pain in right foot: Secondary | ICD-10-CM

## 2020-10-15 MED ORDER — GABAPENTIN 600 MG PO TABS: 600.0000 mg | ORAL_TABLET | Freq: Three times a day (TID) | ORAL | 5 refills | Status: DC

## 2020-10-15 MED ORDER — QUETIAPINE FUMARATE 50 MG PO TABS: 25.0000 mg | ORAL_TABLET | Freq: Every day | ORAL | 2 refills | Status: DC

## 2020-10-15 MED ORDER — FLUOXETINE HCL 40 MG PO CAPS: 40.0000 mg | ORAL_CAPSULE | Freq: Every day | ORAL | 9 refills | Status: DC

## 2020-10-15 MED ORDER — MELOXICAM 15 MG PO TABS: ORAL_TABLET | ORAL | 5 refills | Status: DC

## 2020-10-15 MED ORDER — ATORVASTATIN CALCIUM 40 MG PO TABS: ORAL_TABLET | ORAL | 9 refills | Status: DC

## 2020-10-15 MED ORDER — DIVALPROEX SODIUM ER 500 MG PO TB24: 500.0000 mg | ORAL_TABLET | Freq: Every day | ORAL | 9 refills | Status: DC

## 2020-10-15 NOTE — Progress Notes (Signed)
Chief Complaint  Patient presents with  . Medication Problem    Subjective Cindy Conway presents for f/u anxiety/depression.  Pt is currently being treated with Prozac 40 mg/d, gabapentin 600 mg TID.  Reports doing well since treatment. No thoughts of harming self or others. No self-medication with alcohol, prescription drugs or illicit drugs. Pt is not following with a counselor/psychologist.  Migraines R sided HA's w/o aura.  She takes divalproex 500 mg daily and uses Imitrex as needed.  It normally works.  She uses less than 10 tablets/month.  She reports compliance with the divalproex and reports no adverse effects.  She has a mild headache right now.  No neurologic signs or symptoms.  Hyperlipidemia Patient presents for dyslipidemia follow up. Currently being treated with Lipitor 40 mg daily and compliance with treatment thus far has been good. She denies myalgias. She is sometimes adhering to a healthy diet. Exercise: Active around the house The patient is not known to have coexisting coronary artery disease.  Past Medical History:  Diagnosis Date  . Anxiety   . Depression   . Migraines    Allergies as of 10/15/2020      Reactions   Sulfa Antibiotics Rash      Medication List       Accurate as of October 15, 2020  3:11 PM. If you have any questions, ask your nurse or doctor.        STOP taking these medications   ergocalciferol 1.25 MG (50000 UT) capsule Commonly known as: VITAMIN D2 Stopped by: Sharlene Dory, DO   LORazepam 0.5 MG tablet Commonly known as: ATIVAN Stopped by: Sharlene Dory, DO   pramipexole 0.5 MG tablet Commonly known as: MIRAPEX Stopped by: Sharlene Dory, DO   pramipexole 1 MG tablet Commonly known as: MIRAPEX Stopped by: Sharlene Dory, DO     TAKE these medications   atorvastatin 40 MG tablet Commonly known as: LIPITOR TAKE 1 TABLET(40 MG) BY MOUTH DAILY   divalproex 500 MG 24 hr  tablet Commonly known as: DEPAKOTE ER Take 1 tablet (500 mg total) by mouth daily.   FLUoxetine 40 MG capsule Commonly known as: PROZAC Take 1 capsule (40 mg total) by mouth daily.   gabapentin 600 MG tablet Commonly known as: NEURONTIN Take 1 tablet (600 mg total) by mouth 3 (three) times daily.   meloxicam 15 MG tablet Commonly known as: MOBIC TAKE 1 TABLET(15 MG) BY MOUTH DAILY   multivitamin tablet Take 1 tablet by mouth daily.   QUEtiapine 50 MG tablet Commonly known as: SEROquel Take 0.5-1 tablets (25-50 mg total) by mouth at bedtime. Started by: Sharlene Dory, DO   SUMAtriptan 100 MG tablet Commonly known as: IMITREX TAKE 1 TABLET BY MOUTH. MAY REPEAT IN 2 HOURS IF HEADACHE PERSISTS       Exam BP 108/68 (BP Location: Left Arm, Patient Position: Sitting, Cuff Size: Normal)   Pulse 79   Temp 97.9 F (36.6 C) (Oral)   Ht 5\' 7"  (1.702 m)   Wt 174 lb (78.9 kg)   SpO2 97%   BMI 27.25 kg/m  General:  well developed, well nourished, in no apparent distress Lungs:  No respiratory distress Psych: well oriented with normal range of affect and age-appropriate judgement/insight, alert and oriented x4.  Assessment and Plan  Depression, major, single episode, complete remission (HCC) - Plan: gabapentin (NEURONTIN) 600 MG tablet, FLUoxetine (PROZAC) 40 MG capsule, QUEtiapine (SEROQUEL) 50 MG tablet  GAD (generalized anxiety disorder) -  Plan: gabapentin (NEURONTIN) 600 MG tablet, FLUoxetine (PROZAC) 40 MG capsule, QUEtiapine (SEROQUEL) 50 MG tablet  Migraine without aura and without status migrainosus, not intractable - Plan: divalproex (DEPAKOTE ER) 500 MG 24 hr tablet  Mixed hyperlipidemia - Plan: atorvastatin (LIPITOR) 40 MG tablet  Bilateral foot pain - Plan: meloxicam (MOBIC) 15 MG tablet  1/2.  Continue Prozac 40 mg daily, Neurontin 600 mg 3 times daily, start Seroquel 50 mg nightly.  Counseling information provided.  Counseled on exercise.  I am okay  taking over these medications. 3.  Continue with divalproex 500 mg daily and Imitrex 100 mg as needed. 4.  Continue Lipitor 40 mg daily.  Counseled on diet and exercise. F/u in 6 weeks for physical, getting labs, and rechecking 1/2. The patient voiced understanding and agreement to the plan.  Jilda Roche [place], DO 10/15/20 3:11 PM

## 2020-10-15 NOTE — Patient Instructions (Signed)
Let me know if there are cost issues.   Keep the diet clean and stay active.  Please consider counseling. Contact [phone] to schedule an appointment or inquire about cost/insurance coverage.  Coping skills Choose 5 that work for you:  Take a deep breath  Count to 20  Read a book  Do a puzzle  Meditate  Bake  Sing  Knit  Garden  Pray  Go outside  Call a friend  Listen to music  Take a walk  Color  Send a note  Take a bath  Watch a movie  Be alone in a quiet place  Pet an animal  Visit a friend  Journal  Exercise  Stretch   Let us know if you need anything.

## 2020-11-26 ENCOUNTER — Encounter: Payer: Self-pay | Admitting: Family Medicine

## 2020-11-26 ENCOUNTER — Other Ambulatory Visit: Payer: Self-pay

## 2020-11-26 ENCOUNTER — Ambulatory Visit (INDEPENDENT_AMBULATORY_CARE_PROVIDER_SITE_OTHER): Payer: 59 | Admitting: Family Medicine

## 2020-11-26 VITALS — BP 110/78 | HR 71 | Temp 97.9°F | Ht 67.0 in | Wt 180.2 lb

## 2020-11-26 DIAGNOSIS — Z0000 Encounter for general adult medical examination without abnormal findings: Secondary | ICD-10-CM | POA: Diagnosis not present

## 2020-11-26 DIAGNOSIS — Z1231 Encounter for screening mammogram for malignant neoplasm of breast: Secondary | ICD-10-CM

## 2020-11-26 DIAGNOSIS — Z1159 Encounter for screening for other viral diseases: Secondary | ICD-10-CM | POA: Diagnosis not present

## 2020-11-26 LAB — COMPREHENSIVE METABOLIC PANEL
ALT: 9 U/L (ref 0–35)
AST: 12 U/L (ref 0–37)
Albumin: 4.1 g/dL (ref 3.5–5.2)
Alkaline Phosphatase: 48 U/L (ref 39–117)
BUN: 11 mg/dL (ref 6–23)
CO2: 29 mEq/L (ref 19–32)
Calcium: 9 mg/dL (ref 8.4–10.5)
Chloride: 103 mEq/L (ref 96–112)
Creatinine, Ser: 0.6 mg/dL (ref 0.40–1.20)
GFR: 100.49 mL/min (ref >60.00)
Glucose, Bld: 82 mg/dL (ref 70–99)
Potassium: 4.1 mEq/L (ref 3.5–5.1)
Sodium: 138 mEq/L (ref 135–145)
Total Bilirubin: 0.8 mg/dL (ref 0.2–1.2)
Total Protein: 6.7 g/dL (ref 6.0–8.3)

## 2020-11-26 LAB — CBC
HCT: 40.5 % (ref 36.0–46.0)
Hemoglobin: 13.8 g/dL (ref 12.0–15.0)
MCHC: 34 g/dL (ref 30.0–36.0)
MCV: 87.5 fl (ref 78.0–100.0)
Platelets: 160 10*3/uL (ref 150.0–400.0)
RBC: 4.63 Mil/uL (ref 3.87–5.11)
RDW: 12.9 % (ref 11.5–15.5)
WBC: 4.2 10*3/uL (ref 4.0–10.5)

## 2020-11-26 LAB — LIPID PANEL
Cholesterol: 160 mg/dL (ref 0–200)
HDL: 46.7 mg/dL (ref >39.00)
LDL Cholesterol: 89 mg/dL (ref 0–99)
NonHDL: 113.31
Total CHOL/HDL Ratio: 3
Triglycerides: 124 mg/dL (ref 0.0–149.0)
VLDL: 24.8 mg/dL (ref 0.0–40.0)

## 2020-11-26 NOTE — Patient Instructions (Addendum)
Give [place] 2-3 business days to get the results of your labs back.   Keep the diet clean and stay active.  If you don't receive a kit from the [employer] in the next few weeks, please let me know.  The new Shingrix vaccine (for shingles) is a 2 shot series. It can make people feel low energy, achy and almost like they have the flu for 48 hours after injection. Please plan accordingly when deciding on when to get this shot. Call our office for a nurse visit appointment to get this. The second shot of the series is less severe regarding the side effects, but it still lasts 48 hours.   Call Center for [place] at [facility] at [phone] for an appointment.  They are located at [address], [address], [place], [place], [address] (right across the hall from our office).  Let us know if you need anything.

## 2020-11-26 NOTE — Progress Notes (Signed)
Chief Complaint  Patient presents with  . Annual Exam     Well Woman Cindy Conway is here for a complete physical.   Her last physical was >1 year ago.  Current diet: in general, a "healthy" diet. Current exercise: walking, stairs. Weight is stable and she denies fatigue out of ordinary. Seatbelt? Yes  Health Maintenance Pap/HPV- No Mammogram- No Colon cancer screening-No Shingrix- No Tetanus- Yes Hep C screening- No HIV screening- Yes  Past Medical History:  Diagnosis Date  . Anxiety   . Depression   . Migraines      Past Surgical History:  Procedure Laterality Date  . NO PAST SURGERIES      Medications  Current Outpatient Medications on File Prior to Visit  Medication Sig Dispense Refill  . atorvastatin (LIPITOR) 40 MG tablet TAKE 1 TABLET(40 MG) BY MOUTH DAILY 30 tablet 9  . divalproex (DEPAKOTE ER) 500 MG 24 hr tablet Take 1 tablet (500 mg total) by mouth daily. 30 tablet 9  . FLUoxetine (PROZAC) 40 MG capsule Take 1 capsule (40 mg total) by mouth daily. 30 capsule 9  . gabapentin (NEURONTIN) 600 MG tablet Take 1 tablet (600 mg total) by mouth 3 (three) times daily. 90 tablet 5  . meloxicam (MOBIC) 15 MG tablet TAKE 1 TABLET(15 MG) BY MOUTH DAILY 30 tablet 5  . Multiple Vitamin (MULTIVITAMIN) tablet Take 1 tablet by mouth daily.    . QUEtiapine (SEROQUEL) 50 MG tablet Take 0.5-1 tablets (25-50 mg total) by mouth at bedtime. 30 tablet 2  . SUMAtriptan (IMITREX) 100 MG tablet TAKE 1 TABLET BY MOUTH. MAY REPEAT IN 2 HOURS IF HEADACHE PERSISTS 10 tablet 5    Allergies Allergies  Allergen Reactions  . Sulfa Antibiotics Rash    Review of Systems: Constitutional:  no unexpected weight changes Eye:  no recent significant change in vision Ear/Nose/Mouth/Throat:  Ears:  no recent change in hearing Nose/Mouth/Throat:  no complaints of nasal congestion, no sore throat Cardiovascular: no chest pain Respiratory:  no shortness of breath Gastrointestinal:  no  abdominal pain, no change in bowel habits GU:  Female: negative for dysuria or pelvic pain Musculoskeletal/Extremities:  no pain of the joints Integumentary (Skin/Breast):  no abnormal skin lesions reported Neurologic:  no headaches Endocrine:  denies unexpected fatigue  Exam BP 110/78 (BP Location: Left Arm, Patient Position: Sitting, Cuff Size: Normal)   Pulse 71   Temp 97.9 F (36.6 C) (Oral)   Ht 5\' 7"  (1.702 m)   Wt 180 lb 4 oz (81.8 kg)   SpO2 93%   BMI 28.23 kg/m  General:  well developed, well nourished, in no apparent distress Skin:  no significant moles, warts, or growths Head:  no masses, lesions, or tenderness Eyes:  pupils equal and round, sclera anicteric without injection Ears:  canals without lesions, TMs shiny without retraction, no obvious effusion, no erythema Nose:  nares patent, septum midline, mucosa normal, and no drainage or sinus tenderness Throat/Pharynx:  lips and gingiva without lesion; tongue and uvula midline; non-inflamed pharynx; no exudates or postnasal drainage Neck: neck supple without adenopathy, thyromegaly, or masses Lungs:  clear to auscultation, breath sounds equal bilaterally, no respiratory distress Cardio:  regular rate and rhythm, no LE edema Abdomen:  abdomen soft, nontender; bowel sounds normal; no masses or organomegaly Genital: Defer to GYN Musculoskeletal:  symmetrical muscle groups noted without atrophy or deformity Extremities:  no clubbing, cyanosis, or edema, no deformities, no skin discoloration Neuro:  gait normal; deep tendon reflexes  normal and symmetric Psych: well oriented with normal range of affect and appropriate judgment/insight  Assessment and Plan  Well adult exam - Plan: CBC, Comprehensive metabolic panel, Lipid panel  Encounter for screening mammogram for malignant neoplasm of breast - Plan: MM DIGITAL SCREENING BILATERAL  Encounter for hepatitis C screening test for low risk patient - Plan: Hepatitis C  antibody   Well 55 y.o. female. Counseled on diet and exercise. Other orders as above. Gynecology information provided, mammogram ordered. Hep C screening ordered. Shingrix information provided, declined today. Declined the tetanus shot today as well, she will plan accordingly. Declined colonoscopy, will go through with Cologuard.  We will set that up. Covid booster recommended. Follow up in 6 mo. The patient voiced understanding and agreement to the plan.  Jilda Roche [place], DO 11/26/20 10:20 AM

## 2020-11-29 LAB — HEPATITIS C ANTIBODY
Hepatitis C Ab: NONREACTIVE
SIGNAL TO CUT-OFF: 0.01 (ref <1.00)

## 2020-12-30 ENCOUNTER — Other Ambulatory Visit: Payer: Self-pay | Admitting: Family Medicine

## 2020-12-30 DIAGNOSIS — F325 Major depressive disorder, single episode, in full remission: Secondary | ICD-10-CM

## 2020-12-30 DIAGNOSIS — F411 Generalized anxiety disorder: Secondary | ICD-10-CM

## 2021-01-24 ENCOUNTER — Other Ambulatory Visit: Payer: Self-pay | Admitting: Family Medicine

## 2021-01-24 ENCOUNTER — Ambulatory Visit (HOSPITAL_BASED_OUTPATIENT_CLINIC_OR_DEPARTMENT_OTHER): Payer: 59

## 2021-01-24 ENCOUNTER — Encounter (HOSPITAL_BASED_OUTPATIENT_CLINIC_OR_DEPARTMENT_OTHER): Payer: Self-pay

## 2021-01-24 ENCOUNTER — Inpatient Hospital Stay (HOSPITAL_BASED_OUTPATIENT_CLINIC_OR_DEPARTMENT_OTHER): Admission: RE | Admit: 2021-01-24 | Payer: 59 | Source: Ambulatory Visit

## 2021-01-24 DIAGNOSIS — Z1231 Encounter for screening mammogram for malignant neoplasm of breast: Secondary | ICD-10-CM

## 2021-01-31 ENCOUNTER — Other Ambulatory Visit: Payer: Self-pay | Admitting: Family Medicine

## 2021-01-31 DIAGNOSIS — G43009 Migraine without aura, not intractable, without status migrainosus: Secondary | ICD-10-CM

## 2021-01-31 DIAGNOSIS — M79671 Pain in right foot: Secondary | ICD-10-CM

## 2021-01-31 DIAGNOSIS — E782 Mixed hyperlipidemia: Secondary | ICD-10-CM

## 2021-01-31 DIAGNOSIS — F411 Generalized anxiety disorder: Secondary | ICD-10-CM

## 2021-01-31 DIAGNOSIS — F325 Major depressive disorder, single episode, in full remission: Secondary | ICD-10-CM

## 2021-01-31 MED ORDER — GABAPENTIN 600 MG PO TABS: 600.0000 mg | ORAL_TABLET | Freq: Three times a day (TID) | ORAL | 5 refills | Status: DC

## 2021-01-31 MED ORDER — MELOXICAM 15 MG PO TABS: ORAL_TABLET | ORAL | 0 refills | Status: DC

## 2021-01-31 MED ORDER — QUETIAPINE FUMARATE 50 MG PO TABS
ORAL_TABLET | ORAL | 2 refills | Status: DC
Start: 2021-01-31 — End: 2021-07-25

## 2021-01-31 MED ORDER — FLUOXETINE HCL 40 MG PO CAPS: 40.0000 mg | ORAL_CAPSULE | Freq: Every day | ORAL | 1 refills | Status: DC

## 2021-01-31 MED ORDER — DIVALPROEX SODIUM ER 500 MG PO TB24
500.0000 mg | ORAL_TABLET | Freq: Every day | ORAL | 0 refills | Status: DC
Start: 2021-01-31 — End: 2021-03-10

## 2021-01-31 MED ORDER — ATORVASTATIN CALCIUM 40 MG PO TABS: ORAL_TABLET | ORAL | 1 refills | Status: DC

## 2021-02-02 ENCOUNTER — Other Ambulatory Visit: Payer: Self-pay | Admitting: Family Medicine

## 2021-02-02 DIAGNOSIS — G43009 Migraine without aura, not intractable, without status migrainosus: Secondary | ICD-10-CM

## 2021-03-10 ENCOUNTER — Other Ambulatory Visit: Payer: Self-pay | Admitting: Family Medicine

## 2021-03-10 DIAGNOSIS — G43009 Migraine without aura, not intractable, without status migrainosus: Secondary | ICD-10-CM

## 2021-05-10 ENCOUNTER — Other Ambulatory Visit: Payer: Self-pay | Admitting: Family Medicine

## 2021-05-10 DIAGNOSIS — M79672 Pain in left foot: Secondary | ICD-10-CM

## 2021-05-10 DIAGNOSIS — M79671 Pain in right foot: Secondary | ICD-10-CM

## 2021-05-10 DIAGNOSIS — G43009 Migraine without aura, not intractable, without status migrainosus: Secondary | ICD-10-CM

## 2021-06-03 ENCOUNTER — Ambulatory Visit: Payer: 59 | Admitting: Family Medicine

## 2021-06-18 ENCOUNTER — Other Ambulatory Visit: Payer: Self-pay | Admitting: Family Medicine

## 2021-06-18 DIAGNOSIS — F325 Major depressive disorder, single episode, in full remission: Secondary | ICD-10-CM

## 2021-06-18 DIAGNOSIS — F411 Generalized anxiety disorder: Secondary | ICD-10-CM

## 2021-07-23 ENCOUNTER — Other Ambulatory Visit: Payer: Self-pay | Admitting: Family Medicine

## 2021-07-23 DIAGNOSIS — F325 Major depressive disorder, single episode, in full remission: Secondary | ICD-10-CM

## 2021-07-23 DIAGNOSIS — F411 Generalized anxiety disorder: Secondary | ICD-10-CM

## 2021-07-23 DIAGNOSIS — M79671 Pain in right foot: Secondary | ICD-10-CM

## 2021-07-29 ENCOUNTER — Ambulatory Visit: Payer: 59 | Admitting: Family Medicine

## 2021-08-03 ENCOUNTER — Ambulatory Visit: Payer: 59 | Admitting: Family Medicine

## 2021-08-05 ENCOUNTER — Encounter: Payer: Self-pay | Admitting: Family Medicine

## 2021-08-05 ENCOUNTER — Ambulatory Visit (INDEPENDENT_AMBULATORY_CARE_PROVIDER_SITE_OTHER): Payer: 59 | Admitting: Family Medicine

## 2021-08-05 DIAGNOSIS — G43009 Migraine without aura, not intractable, without status migrainosus: Secondary | ICD-10-CM

## 2021-08-05 DIAGNOSIS — F325 Major depressive disorder, single episode, in full remission: Secondary | ICD-10-CM

## 2021-08-05 DIAGNOSIS — F411 Generalized anxiety disorder: Secondary | ICD-10-CM | POA: Diagnosis not present

## 2021-08-05 DIAGNOSIS — E782 Mixed hyperlipidemia: Secondary | ICD-10-CM | POA: Diagnosis not present

## 2021-08-05 MED ORDER — GABAPENTIN 600 MG PO TABS: 600.0000 mg | ORAL_TABLET | Freq: Three times a day (TID) | ORAL | 10 refills | Status: DC

## 2021-08-05 MED ORDER — QUETIAPINE FUMARATE 50 MG PO TABS
50.0000 mg | ORAL_TABLET | Freq: Every day | ORAL | 10 refills | Status: DC
Start: 2021-08-05 — End: 2022-05-01

## 2021-08-05 MED ORDER — FLUOXETINE HCL 40 MG PO CAPS: ORAL_CAPSULE | ORAL | 10 refills | Status: DC

## 2021-08-05 MED ORDER — SUMATRIPTAN SUCCINATE 100 MG PO TABS: ORAL_TABLET | ORAL | 5 refills | Status: DC

## 2021-08-05 MED ORDER — DIVALPROEX SODIUM ER 500 MG PO TB24
500.0000 mg | ORAL_TABLET | Freq: Every day | ORAL | 10 refills | Status: DC
Start: 2021-08-05 — End: 2021-10-26

## 2021-08-05 MED ORDER — ATORVASTATIN CALCIUM 40 MG PO TABS: ORAL_TABLET | ORAL | 11 refills | Status: DC

## 2021-08-05 NOTE — Progress Notes (Signed)
Chief Complaint  Patient presents with   Follow-up    medication    Subjective Cindy Conway presents for f/u anxiety/depression.  Pt is currently being treated with Prozac 40 mg/d, Seroquel 50 mg qhs.  Reports doing well since treatment. No thoughts of harming self or others. No self-medication with alcohol, prescription drugs or illicit drugs. Pt is not following with a counselor/psychologist.  Headaches- stable on Depakote and Imitrex prn.  Reports compliance, no adverse effects.  Gets around 2-4 days per month w headaches.   Hyperlipidemia Patient presents for mixed hyperlipidemia follow up. Currently being treated with Lipitor 40 mg/d and compliance with treatment thus far has been good. She denies myalgias. She is sometimes adhering to a healthy diet. Exercise: some walking The patient is not known to have coexisting coronary artery disease. No Cp or SOB.   Past Medical History:  Diagnosis Date   Anxiety    Depression    Migraines    Allergies as of 08/05/2021       Reactions   Sulfa Antibiotics Rash        Medication List        Accurate as of August 05, 2021  2:22 PM. If you have any questions, ask your nurse or doctor.          atorvastatin 40 MG tablet Commonly known as: LIPITOR TAKE 1 TABLET(40 MG) BY MOUTH DAILY   divalproex 500 MG 24 hr tablet Commonly known as: DEPAKOTE ER Take 1 tablet (500 mg total) by mouth daily.   FLUoxetine 40 MG capsule Commonly known as: PROZAC TAKE 1 CAPSULE(40 MG) BY MOUTH DAILY   gabapentin 600 MG tablet Commonly known as: NEURONTIN Take 1 tablet (600 mg total) by mouth 3 (three) times daily.   meloxicam 15 MG tablet Commonly known as: MOBIC TAKE 1 TABLET(15 MG) BY MOUTH DAILY   multivitamin tablet Take 1 tablet by mouth daily.   QUEtiapine 50 MG tablet Commonly known as: SEROQUEL Take 1 tablet (50 mg total) by mouth at bedtime. What changed: See the new instructions. Changed by: Sharlene Dory, DO   SUMAtriptan 100 MG tablet Commonly known as: IMITREX TAKE 1 TABLET BY MOUTH, MAY REPEAT IN 2 HOURS IF HEADACHE PERSISTS        Exam BP 110/70    Pulse 90    Temp 98.4 F (36.9 C) (Oral)    Ht 5\' 7"  (1.702 m)    Wt 203 lb 4 oz (92.2 kg)    SpO2 95%    BMI 31.83 kg/m  General:  well developed, well nourished, in no apparent distress Heart: RRR, no lower extremity edema, no bruits MSK: Tight trapezius muscles bilaterally, no tenderness over the or the suboccipital triangle/paraspinal cervical musculature Neuro: DTRs equal and symmetric throughout, no clonus, no cerebellar signs, gait is normal Lungs: CTAB.  No respiratory distress Psych: well oriented with normal range of affect and age-appropriate judgement/insight, alert and oriented x4.  Assessment and Plan  Depression, major, single episode, complete remission (HCC) - Plan: QUEtiapine (SEROQUEL) 50 MG tablet, gabapentin (NEURONTIN) 600 MG tablet, FLUoxetine (PROZAC) 40 MG capsule  GAD (generalized anxiety disorder) - Plan: QUEtiapine (SEROQUEL) 50 MG tablet, gabapentin (NEURONTIN) 600 MG tablet, FLUoxetine (PROZAC) 40 MG capsule  Migraine without aura and without status migrainosus, not intractable - Plan: SUMAtriptan (IMITREX) 100 MG tablet, divalproex (DEPAKOTE ER) 500 MG 24 hr tablet  Mixed hyperlipidemia - Plan: atorvastatin (LIPITOR) 40 MG tablet  1/2.  Chronic, stable.  Continue  Prozac 40 mg daily, gabapentin 600 mg 3 times daily, Seroquel 50 mg nightly.  This was started by psychiatry and we will continue it given her convenience and lower cost in addition to her stability. 3.  Chronic, stable.  Continue Depakote 500 mg daily and Imitrex as needed. 4.  Chronic, stable.  Counseled on diet and exercise, continue Lipitor 40 mg daily. F/u in 6 mo for physical or as needed.  We did discuss some health maintenance issues that she is tardy on.  She admits she has not been diligent with some of these and would like to  wait until 2023 to reinitiate these efforts. The patient voiced understanding and agreement to the plan.  Jilda Roche [place], DO 08/05/21 2:22 PM

## 2021-08-05 NOTE — Patient Instructions (Addendum)
Keep the diet clean and stay active.  Let me know if/when you would like a mammogram/colonoscopy.  Call Center for [facility] at [facility] at [phone] for an appointment.  They are located at [address], [address], [place], [place], [address] (right across the hall from our office).  Let us know if you need anything.

## 2021-08-10 ENCOUNTER — Ambulatory Visit: Payer: 59 | Admitting: Family Medicine

## 2021-09-23 ENCOUNTER — Other Ambulatory Visit: Payer: Self-pay | Admitting: Family Medicine

## 2021-09-23 DIAGNOSIS — M79671 Pain in right foot: Secondary | ICD-10-CM

## 2021-09-23 DIAGNOSIS — M79672 Pain in left foot: Secondary | ICD-10-CM

## 2021-09-23 MED ORDER — MELOXICAM 15 MG PO TABS: ORAL_TABLET | ORAL | 0 refills | Status: DC

## 2021-10-24 ENCOUNTER — Other Ambulatory Visit: Payer: Self-pay | Admitting: Family Medicine

## 2021-10-24 DIAGNOSIS — M79671 Pain in right foot: Secondary | ICD-10-CM

## 2021-10-26 ENCOUNTER — Other Ambulatory Visit: Payer: Self-pay | Admitting: Family Medicine

## 2021-10-26 DIAGNOSIS — G43009 Migraine without aura, not intractable, without status migrainosus: Secondary | ICD-10-CM

## 2021-11-08 ENCOUNTER — Other Ambulatory Visit: Payer: Self-pay | Admitting: Family Medicine

## 2021-11-08 DIAGNOSIS — F325 Major depressive disorder, single episode, in full remission: Secondary | ICD-10-CM

## 2021-11-08 DIAGNOSIS — F411 Generalized anxiety disorder: Secondary | ICD-10-CM

## 2021-12-27 ENCOUNTER — Encounter: Payer: Self-pay | Admitting: Family Medicine

## 2021-12-27 ENCOUNTER — Other Ambulatory Visit (HOSPITAL_BASED_OUTPATIENT_CLINIC_OR_DEPARTMENT_OTHER): Payer: Self-pay

## 2021-12-27 DIAGNOSIS — M79671 Pain in right foot: Secondary | ICD-10-CM

## 2021-12-27 MED ORDER — MELOXICAM 15 MG PO TABS
15.0000 mg | ORAL_TABLET | Freq: Every day | ORAL | 0 refills | Status: DC
  Filled 2021-12-27: qty 30, 30d supply, fill #0

## 2022-02-13 ENCOUNTER — Other Ambulatory Visit (HOSPITAL_BASED_OUTPATIENT_CLINIC_OR_DEPARTMENT_OTHER): Payer: Self-pay

## 2022-02-13 ENCOUNTER — Other Ambulatory Visit: Payer: Self-pay | Admitting: Family Medicine

## 2022-02-13 ENCOUNTER — Encounter: Payer: Self-pay | Admitting: Family Medicine

## 2022-02-13 DIAGNOSIS — M79671 Pain in right foot: Secondary | ICD-10-CM

## 2022-02-13 MED ORDER — MELOXICAM 15 MG PO TABS
15.0000 mg | ORAL_TABLET | Freq: Every day | ORAL | 2 refills | Status: DC
  Filled 2022-02-14 – 2022-04-06 (×3): qty 30, 30d supply, fill #0
  Filled 2022-05-09 – 2022-09-22 (×4): qty 30, 30d supply, fill #1
  Filled 2022-10-17 – 2023-01-09 (×3): qty 30, 30d supply, fill #2

## 2022-02-13 MED ORDER — MELOXICAM 15 MG PO TABS
15.0000 mg | ORAL_TABLET | Freq: Every day | ORAL | 0 refills | Status: DC
  Filled 2022-02-13: qty 30, 30d supply, fill #0

## 2022-02-14 ENCOUNTER — Other Ambulatory Visit (HOSPITAL_BASED_OUTPATIENT_CLINIC_OR_DEPARTMENT_OTHER): Payer: Self-pay

## 2022-02-19 ENCOUNTER — Encounter: Payer: Self-pay | Admitting: Family Medicine

## 2022-02-20 ENCOUNTER — Ambulatory Visit: Payer: 59 | Admitting: Family Medicine

## 2022-02-23 ENCOUNTER — Other Ambulatory Visit (HOSPITAL_BASED_OUTPATIENT_CLINIC_OR_DEPARTMENT_OTHER): Payer: Self-pay

## 2022-02-24 ENCOUNTER — Ambulatory Visit: Payer: 59 | Admitting: Family Medicine

## 2022-03-08 ENCOUNTER — Ambulatory Visit: Payer: 59 | Admitting: Family Medicine

## 2022-03-17 ENCOUNTER — Ambulatory Visit: Payer: 59 | Admitting: Family Medicine

## 2022-03-17 ENCOUNTER — Other Ambulatory Visit (HOSPITAL_COMMUNITY): Payer: Self-pay

## 2022-03-22 ENCOUNTER — Other Ambulatory Visit (HOSPITAL_COMMUNITY): Payer: Self-pay

## 2022-03-22 ENCOUNTER — Encounter (HOSPITAL_COMMUNITY): Payer: Self-pay

## 2022-03-24 ENCOUNTER — Ambulatory Visit: Payer: 59 | Admitting: Family Medicine

## 2022-03-31 ENCOUNTER — Other Ambulatory Visit (HOSPITAL_COMMUNITY): Payer: Self-pay

## 2022-04-06 ENCOUNTER — Other Ambulatory Visit (HOSPITAL_BASED_OUTPATIENT_CLINIC_OR_DEPARTMENT_OTHER): Payer: Self-pay

## 2022-04-12 ENCOUNTER — Ambulatory Visit: Payer: 59 | Admitting: Family Medicine

## 2022-04-28 ENCOUNTER — Telehealth: Payer: 59 | Admitting: Family Medicine

## 2022-05-01 ENCOUNTER — Telehealth (INDEPENDENT_AMBULATORY_CARE_PROVIDER_SITE_OTHER): Payer: 59 | Admitting: Family Medicine

## 2022-05-01 ENCOUNTER — Other Ambulatory Visit (HOSPITAL_BASED_OUTPATIENT_CLINIC_OR_DEPARTMENT_OTHER): Payer: Self-pay

## 2022-05-01 ENCOUNTER — Encounter: Payer: Self-pay | Admitting: Family Medicine

## 2022-05-01 DIAGNOSIS — E782 Mixed hyperlipidemia: Secondary | ICD-10-CM | POA: Diagnosis not present

## 2022-05-01 DIAGNOSIS — F325 Major depressive disorder, single episode, in full remission: Secondary | ICD-10-CM

## 2022-05-01 DIAGNOSIS — F411 Generalized anxiety disorder: Secondary | ICD-10-CM

## 2022-05-01 DIAGNOSIS — G43009 Migraine without aura, not intractable, without status migrainosus: Secondary | ICD-10-CM | POA: Diagnosis not present

## 2022-05-01 MED ORDER — LURASIDONE HCL 40 MG PO TABS
ORAL_TABLET | ORAL | 2 refills | Status: DC
  Filled 2022-05-01: qty 30, 30d supply, fill #0
  Filled 2022-05-28 – 2022-09-22 (×3): qty 30, 30d supply, fill #1

## 2022-05-01 NOTE — Progress Notes (Signed)
CC: F/u  Subjective Cindy Conway presents for f/u anxiety/depression. Due to COVID-19 pandemic, we are interacting via web portal for an electronic face-to-face visit. I verified patient's ID using 2 identifiers. Patient agreed to proceed with visit via this method. Patient is in a parked car, I am at office. Patient and I are present for visit.   Pt is currently being treated with Prozac 40 mg/d, Seroquel 50 mg qhs.  Reports struggling some since treatment. No thoughts of harming self or others. No self-medication with alcohol, prescription drugs or illicit drugs. Pt is not following with a counselor/psychologist.  Hyperlipidemia Patient presents for dyslipidemia follow up. Currently being treated with Lipitor 40 mg and compliance with treatment thus far has been good. She denies myalgias. She is sometimes adhering to a healthy diet. Exercise: Walking The patient is not known to have coexisting coronary artery disease.  Migraines Patient has a history of chronic migraines.  She takes Depakote 500 mg twice daily.  She reports compliance and no adverse effects.  She will take Imitrex 100 mg daily as needed for abortive therapy. Usually has less than 10 HA's per mo.   Past Medical History:  Diagnosis Date   Anxiety    Depression    Migraines    Allergies as of 05/01/2022       Reactions   Sulfa Antibiotics Rash        Medication List        Accurate as of May 01, 2022  1:02 PM. If you have any questions, ask your nurse or doctor.          STOP taking these medications    gabapentin 600 MG tablet Commonly known as: NEURONTIN Stopped by: Sharlene Dory, DO   QUEtiapine 50 MG tablet Commonly known as: SEROQUEL Stopped by: Sharlene Dory, DO       TAKE these medications    atorvastatin 40 MG tablet Commonly known as: LIPITOR TAKE 1 TABLET(40 MG) BY MOUTH DAILY   divalproex 500 MG 24 hr tablet Commonly known as: DEPAKOTE ER TAKE 1  TABLET(500 MG) BY MOUTH DAILY   FLUoxetine 40 MG capsule Commonly known as: PROZAC TAKE 1 CAPSULE(40 MG) BY MOUTH DAILY   lurasidone 40 MG Tabs tablet Commonly known as: LATUDA Take 1/2 tab daily for 2 weeks then 1 tab daily. Started by: Sharlene Dory, DO   meloxicam 15 MG tablet Commonly known as: MOBIC Take 1 tablet (15 mg total) by mouth daily.   multivitamin tablet Take 1 tablet by mouth daily.   SUMAtriptan 100 MG tablet Commonly known as: IMITREX TAKE 1 TABLET BY MOUTH, MAY REPEAT IN 2 HOURS IF HEADACHE PERSISTS        Exam No conversational dyspnea Age appropriate judgment and insight Nml affect and mood  Assessment and Plan  Depression, major, single episode, complete remission (HCC)  GAD (generalized anxiety disorder)  Mixed hyperlipidemia  Migraine without aura and without status migrainosus, not intractable  1/2. Chronic, uncontrolled. Stop Seroquel, start Latuda 20 mg/d for 2 weeks and then increase to 40 mg/d. Cont Prozac 40 mg/d. F/u in 1 month. 3. Chronic, stable. Cont Lipitor 40 mg/d. Counseled on diet/exercise.  4. Chronic, stable. Cont Depakote 500 mg bid, Imitrex prn.  The patient voiced understanding and agreement to the plan.  Jilda Roche [place], DO 05/01/22 1:02 PM

## 2022-05-10 ENCOUNTER — Other Ambulatory Visit (HOSPITAL_COMMUNITY): Payer: Self-pay

## 2022-05-15 ENCOUNTER — Other Ambulatory Visit (HOSPITAL_COMMUNITY): Payer: Self-pay

## 2022-05-20 ENCOUNTER — Other Ambulatory Visit (HOSPITAL_COMMUNITY): Payer: Self-pay

## 2022-05-29 ENCOUNTER — Other Ambulatory Visit (HOSPITAL_BASED_OUTPATIENT_CLINIC_OR_DEPARTMENT_OTHER): Payer: Self-pay

## 2022-05-29 ENCOUNTER — Encounter: Payer: Self-pay | Admitting: Family Medicine

## 2022-06-09 ENCOUNTER — Other Ambulatory Visit (HOSPITAL_BASED_OUTPATIENT_CLINIC_OR_DEPARTMENT_OTHER): Payer: Self-pay

## 2022-06-28 ENCOUNTER — Other Ambulatory Visit (HOSPITAL_BASED_OUTPATIENT_CLINIC_OR_DEPARTMENT_OTHER): Payer: Self-pay

## 2022-06-28 ENCOUNTER — Encounter: Payer: Self-pay | Admitting: Family Medicine

## 2022-06-28 DIAGNOSIS — G43009 Migraine without aura, not intractable, without status migrainosus: Secondary | ICD-10-CM

## 2022-06-28 MED ORDER — SUMATRIPTAN SUCCINATE 100 MG PO TABS: ORAL_TABLET | ORAL | 5 refills | Status: DC

## 2022-06-28 MED ORDER — SUMATRIPTAN SUCCINATE 100 MG PO TABS
ORAL_TABLET | ORAL | 5 refills | Status: DC
  Filled 2022-06-28: qty 10, fill #0

## 2022-06-28 NOTE — Addendum Note (Signed)
Addended byConrad [place] D on: 06/28/2022 12:39 PM   Modules accepted: Orders

## 2022-07-06 ENCOUNTER — Other Ambulatory Visit (HOSPITAL_BASED_OUTPATIENT_CLINIC_OR_DEPARTMENT_OTHER): Payer: Self-pay

## 2022-08-07 ENCOUNTER — Other Ambulatory Visit: Payer: Self-pay | Admitting: Family Medicine

## 2022-08-07 DIAGNOSIS — F325 Major depressive disorder, single episode, in full remission: Secondary | ICD-10-CM

## 2022-08-07 DIAGNOSIS — F411 Generalized anxiety disorder: Secondary | ICD-10-CM

## 2022-08-07 MED ORDER — FLUOXETINE HCL 40 MG PO CAPS: ORAL_CAPSULE | ORAL | 10 refills | Status: DC

## 2022-08-27 ENCOUNTER — Encounter: Payer: Self-pay | Admitting: Family Medicine

## 2022-08-28 ENCOUNTER — Other Ambulatory Visit (HOSPITAL_BASED_OUTPATIENT_CLINIC_OR_DEPARTMENT_OTHER): Payer: Self-pay

## 2022-08-28 ENCOUNTER — Other Ambulatory Visit: Payer: Self-pay | Admitting: Family Medicine

## 2022-08-28 DIAGNOSIS — F325 Major depressive disorder, single episode, in full remission: Secondary | ICD-10-CM

## 2022-08-28 DIAGNOSIS — F411 Generalized anxiety disorder: Secondary | ICD-10-CM

## 2022-08-28 MED ORDER — QUETIAPINE FUMARATE 50 MG PO TABS
25.0000 mg | ORAL_TABLET | Freq: Every day | ORAL | 2 refills | Status: DC
  Filled 2022-08-28: qty 30, 30d supply, fill #0
  Filled 2022-09-22: qty 30, 30d supply, fill #1
  Filled 2022-10-17 – 2022-11-01 (×2): qty 30, 30d supply, fill #2

## 2022-10-25 ENCOUNTER — Other Ambulatory Visit (HOSPITAL_BASED_OUTPATIENT_CLINIC_OR_DEPARTMENT_OTHER): Payer: Self-pay

## 2022-11-13 ENCOUNTER — Other Ambulatory Visit: Payer: Self-pay | Admitting: Family Medicine

## 2022-11-13 DIAGNOSIS — G43009 Migraine without aura, not intractable, without status migrainosus: Secondary | ICD-10-CM

## 2022-11-13 MED ORDER — DIVALPROEX SODIUM ER 500 MG PO TB24: ORAL_TABLET | ORAL | 3 refills | Status: DC

## 2022-11-24 ENCOUNTER — Other Ambulatory Visit: Payer: Self-pay | Admitting: Family Medicine

## 2022-11-24 ENCOUNTER — Other Ambulatory Visit (HOSPITAL_BASED_OUTPATIENT_CLINIC_OR_DEPARTMENT_OTHER): Payer: Self-pay

## 2022-11-24 ENCOUNTER — Other Ambulatory Visit: Payer: Self-pay

## 2022-11-24 DIAGNOSIS — F325 Major depressive disorder, single episode, in full remission: Secondary | ICD-10-CM

## 2022-11-24 DIAGNOSIS — F411 Generalized anxiety disorder: Secondary | ICD-10-CM

## 2022-11-24 MED ORDER — QUETIAPINE FUMARATE 50 MG PO TABS
25.0000 mg | ORAL_TABLET | Freq: Every day | ORAL | 2 refills | Status: DC
  Filled 2022-11-24 – 2022-12-04 (×2): qty 30, 30d supply, fill #0
  Filled 2022-12-30: qty 30, 30d supply, fill #1
  Filled 2023-01-23: qty 30, 30d supply, fill #2

## 2022-12-04 ENCOUNTER — Other Ambulatory Visit (HOSPITAL_BASED_OUTPATIENT_CLINIC_OR_DEPARTMENT_OTHER): Payer: Self-pay

## 2023-01-09 ENCOUNTER — Other Ambulatory Visit: Payer: Self-pay | Admitting: Family Medicine

## 2023-01-09 ENCOUNTER — Other Ambulatory Visit: Payer: Self-pay

## 2023-01-09 ENCOUNTER — Other Ambulatory Visit (HOSPITAL_BASED_OUTPATIENT_CLINIC_OR_DEPARTMENT_OTHER): Payer: Self-pay

## 2023-01-09 ENCOUNTER — Encounter: Payer: Self-pay | Admitting: Family Medicine

## 2023-01-09 DIAGNOSIS — G43009 Migraine without aura, not intractable, without status migrainosus: Secondary | ICD-10-CM

## 2023-01-09 DIAGNOSIS — F325 Major depressive disorder, single episode, in full remission: Secondary | ICD-10-CM

## 2023-01-09 DIAGNOSIS — F411 Generalized anxiety disorder: Secondary | ICD-10-CM

## 2023-01-09 MED ORDER — SUMATRIPTAN SUCCINATE 100 MG PO TABS
ORAL_TABLET | ORAL | 5 refills | Status: DC
Start: 2023-01-09 — End: 2023-03-02
  Filled 2023-01-09: qty 10, 30d supply, fill #0
  Filled 2023-01-23 – 2023-02-01 (×2): qty 10, 30d supply, fill #1
  Filled 2023-02-26: qty 9, 30d supply, fill #2

## 2023-01-09 MED ORDER — GABAPENTIN 600 MG PO TABS
600.0000 mg | ORAL_TABLET | Freq: Three times a day (TID) | ORAL | 10 refills | Status: DC
Start: 2023-01-09 — End: 2024-02-12
  Filled 2023-01-09: qty 90, 30d supply, fill #0
  Filled 2023-02-26: qty 90, 30d supply, fill #1

## 2023-01-23 ENCOUNTER — Other Ambulatory Visit: Payer: Self-pay

## 2023-01-23 ENCOUNTER — Other Ambulatory Visit (HOSPITAL_BASED_OUTPATIENT_CLINIC_OR_DEPARTMENT_OTHER): Payer: Self-pay

## 2023-02-26 ENCOUNTER — Other Ambulatory Visit (HOSPITAL_BASED_OUTPATIENT_CLINIC_OR_DEPARTMENT_OTHER): Payer: Self-pay

## 2023-02-26 ENCOUNTER — Other Ambulatory Visit: Payer: Self-pay | Admitting: Family Medicine

## 2023-02-26 DIAGNOSIS — M79671 Pain in right foot: Secondary | ICD-10-CM

## 2023-02-26 DIAGNOSIS — F411 Generalized anxiety disorder: Secondary | ICD-10-CM

## 2023-02-26 DIAGNOSIS — F325 Major depressive disorder, single episode, in full remission: Secondary | ICD-10-CM

## 2023-02-26 MED ORDER — MELOXICAM 15 MG PO TABS
15.0000 mg | ORAL_TABLET | Freq: Every day | ORAL | 2 refills | Status: DC
Start: 2023-02-26 — End: 2023-07-09
  Filled 2023-02-26 (×2): qty 30, 30d supply, fill #0
  Filled 2023-04-28 – 2023-05-11 (×2): qty 30, 30d supply, fill #1
  Filled 2023-06-03: qty 30, 30d supply, fill #2

## 2023-02-26 MED ORDER — QUETIAPINE FUMARATE 50 MG PO TABS
25.0000 mg | ORAL_TABLET | Freq: Every day | ORAL | 2 refills | Status: DC
Start: 2023-02-26 — End: 2023-03-02
  Filled 2023-02-26 (×2): qty 30, 30d supply, fill #0

## 2023-02-28 ENCOUNTER — Ambulatory Visit: Payer: 59 | Admitting: Family Medicine

## 2023-03-02 ENCOUNTER — Encounter: Payer: Self-pay | Admitting: Family Medicine

## 2023-03-02 ENCOUNTER — Ambulatory Visit (INDEPENDENT_AMBULATORY_CARE_PROVIDER_SITE_OTHER): Payer: 59 | Admitting: Family Medicine

## 2023-03-02 ENCOUNTER — Other Ambulatory Visit (HOSPITAL_BASED_OUTPATIENT_CLINIC_OR_DEPARTMENT_OTHER): Payer: Self-pay

## 2023-03-02 VITALS — BP 108/64 | HR 76 | Temp 97.8°F | Ht 67.0 in | Wt 190.0 lb

## 2023-03-02 DIAGNOSIS — G43009 Migraine without aura, not intractable, without status migrainosus: Secondary | ICD-10-CM

## 2023-03-02 DIAGNOSIS — F411 Generalized anxiety disorder: Secondary | ICD-10-CM

## 2023-03-02 DIAGNOSIS — F325 Major depressive disorder, single episode, in full remission: Secondary | ICD-10-CM

## 2023-03-02 DIAGNOSIS — Z1211 Encounter for screening for malignant neoplasm of colon: Secondary | ICD-10-CM

## 2023-03-02 DIAGNOSIS — E782 Mixed hyperlipidemia: Secondary | ICD-10-CM | POA: Diagnosis not present

## 2023-03-02 MED ORDER — ATORVASTATIN CALCIUM 40 MG PO TABS
ORAL_TABLET | ORAL | 11 refills | Status: DC
Start: 2023-03-02 — End: 2024-03-15
  Filled 2023-03-02: qty 30, 30d supply, fill #0
  Filled 2023-04-28 – 2023-05-11 (×2): qty 30, 30d supply, fill #1
  Filled 2023-06-13: qty 30, 30d supply, fill #2
  Filled 2023-07-09: qty 30, 30d supply, fill #3
  Filled 2023-08-06: qty 30, 30d supply, fill #4
  Filled 2023-09-07 – 2023-09-18 (×2): qty 30, 30d supply, fill #5
  Filled 2023-10-15: qty 30, 30d supply, fill #6
  Filled 2023-11-11: qty 30, 30d supply, fill #7
  Filled 2023-12-10: qty 30, 30d supply, fill #8
  Filled 2024-01-12: qty 30, 30d supply, fill #9
  Filled 2024-01-21 – 2024-02-13 (×2): qty 30, 30d supply, fill #10

## 2023-03-02 MED ORDER — FLUOXETINE HCL 40 MG PO CAPS
40.0000 mg | ORAL_CAPSULE | Freq: Every day | ORAL | 6 refills | Status: DC
Start: 2023-03-02 — End: 2024-01-12
  Filled 2023-03-02 – 2023-05-11 (×6): qty 30, 30d supply, fill #0
  Filled 2023-07-09: qty 30, 30d supply, fill #1
  Filled 2023-08-06: qty 30, 30d supply, fill #2
  Filled 2023-09-07 – 2023-09-18 (×3): qty 30, 30d supply, fill #3
  Filled 2023-10-15: qty 30, 30d supply, fill #4
  Filled 2023-11-11: qty 30, 30d supply, fill #5
  Filled 2023-12-10: qty 30, 30d supply, fill #6

## 2023-03-02 MED ORDER — DIVALPROEX SODIUM ER 500 MG PO TB24
ORAL_TABLET | ORAL | 6 refills | Status: DC
Start: 2023-03-02 — End: 2023-11-11
  Filled 2023-03-02: qty 30, 30d supply, fill #0
  Filled 2023-04-28 – 2023-05-11 (×3): qty 30, 30d supply, fill #1
  Filled 2023-06-03: qty 30, 30d supply, fill #2
  Filled 2023-07-09: qty 30, 30d supply, fill #3
  Filled 2023-08-06: qty 30, 30d supply, fill #4
  Filled 2023-09-07 – 2023-09-18 (×3): qty 30, 30d supply, fill #5
  Filled 2023-10-15: qty 30, 30d supply, fill #6

## 2023-03-02 MED ORDER — QUETIAPINE FUMARATE 50 MG PO TABS
25.0000 mg | ORAL_TABLET | Freq: Every day | ORAL | 6 refills | Status: DC
Start: 2023-03-02 — End: 2023-09-07
  Filled 2023-03-02: qty 30, 30d supply, fill #0
  Filled 2023-04-09: qty 30, 30d supply, fill #1
  Filled 2023-04-28 – 2023-05-11 (×3): qty 30, 30d supply, fill #2
  Filled 2023-06-03: qty 30, 30d supply, fill #3
  Filled 2023-07-09: qty 30, 30d supply, fill #4
  Filled 2023-08-06: qty 30, 30d supply, fill #5
  Filled 2023-08-29: qty 30, 30d supply, fill #6

## 2023-03-02 MED ORDER — SUMATRIPTAN SUCCINATE 100 MG PO TABS
ORAL_TABLET | ORAL | 5 refills | Status: DC
Start: 2023-03-02 — End: 2023-03-23
  Filled 2023-03-02: qty 9, 30d supply, fill #0

## 2023-03-02 NOTE — Progress Notes (Signed)
Chief Complaint  Patient presents with   medication    Needs Refills today     Subjective Cindy Conway presents for f/u anxiety/depression.  Pt is currently being treated with Seroquel 25-50 mg qhs, Prozac 40 mg/d, Depakote 500 mg/d.  Reports doing OK since treatment. No thoughts of harming self or others. No self-medication with alcohol, prescription drugs or illicit drugs. Pt is not following with a counselor/psychologist.  Hyperlipidemia Patient presents for dyslipidemia follow up. Currently being treated with Lipitor 40 mg/d and compliance with treatment thus far has been good. She denies myalgias. She is adhering to a healthy diet. Exercise: some walking The patient is not known to have coexisting coronary artery disease. No CP or SOB.  Headaches Hx of migraines. Taking Depakote as above and Imitrex prn. Both work, some months she has more headaches than medication. Stress is relatively high.   Past Medical History:  Diagnosis Date   Anxiety    Depression    Migraines    Allergies as of 03/02/2023       Reactions   Sulfa Antibiotics Rash        Medication List        Accurate as of March 02, 2023  3:38 PM. If you have any questions, ask your nurse or doctor.          STOP taking these medications    lurasidone 40 MG Tabs tablet Commonly known as: LATUDA Stopped by: Jilda Roche Ruthia Person   multivitamin tablet Stopped by: Jilda Roche Xandria Gallaga       TAKE these medications    atorvastatin 40 MG tablet Commonly known as: LIPITOR TAKE 1 TABLET(40 MG) BY MOUTH DAILY   divalproex 500 MG 24 hr tablet Commonly known as: DEPAKOTE ER TAKE 1 TABLET(500 MG) BY MOUTH DAILY   FLUoxetine 40 MG capsule Commonly known as: PROZAC TAKE 1 CAPSULE(40 MG) BY MOUTH DAILY   gabapentin 600 MG tablet Commonly known as: NEURONTIN TAKE 1 TABLET(600 MG) BY MOUTH THREE TIMES DAILY   meloxicam 15 MG tablet Commonly known as: MOBIC Take 1 tablet (15 mg  total) by mouth daily.   QUEtiapine 50 MG tablet Commonly known as: SEROquel Take 0.5-1 tablets (25-50 mg total) by mouth at bedtime.   SUMAtriptan 100 MG tablet Commonly known as: IMITREX TAKE 1 TABLET BY MOUTH ONCE, MAY REPEAT IN 2 HOURS IF HEADACHE PERSISTS        Exam BP 108/64 (BP Location: Left Arm, Patient Position: Sitting, Cuff Size: Normal)   Pulse 76   Temp 97.8 F (36.6 C) (Oral)   Ht 5\' 7"  (1.702 m)   Wt 190 lb (86.2 kg)   SpO2 96%   BMI 29.76 kg/m  General:  well developed, well nourished, in no apparent distress Heart: RRR, no lower extremity edema bilaterally Lungs: CTAB. No respiratory distress Psych: well oriented with normal range of affect and age-appropriate judgement/insight, alert and oriented x4.  Assessment and Plan  Mixed hyperlipidemia - Plan: atorvastatin (LIPITOR) 40 MG tablet  GAD (generalized anxiety disorder) - Plan: QUEtiapine (SEROQUEL) 50 MG tablet, FLUoxetine (PROZAC) 40 MG capsule  Depression, major, single episode, complete remission (HCC) - Plan: QUEtiapine (SEROQUEL) 50 MG tablet, FLUoxetine (PROZAC) 40 MG capsule  Migraine without aura and without status migrainosus, not intractable - Plan: SUMAtriptan (IMITREX) 100 MG tablet, divalproex (DEPAKOTE ER) 500 MG 24 hr tablet  Chronic, stable. Cont Lipitor 40 mg/d. Counseled on diet/exercise. 2/3. Chronic, stable. Cont Seroquel 25-50 mg qhs, Prozac 40 mg/d, Depakote  500 mg/d 4. Chronic, not fully controlled. Depakote as above, Imitrex prn. Mg 200-500 mg/d for added prophylaxis.  Will set up Cologuard, GYN information provided today. F/u in 6 mo. The patient voiced understanding and agreement to the plan.  Jilda Roche [place], DO 03/02/23 3:38 PM

## 2023-03-02 NOTE — Patient Instructions (Addendum)
Consider taking magnesium 200-500 mg daily for your headaches.   Keep the diet clean and stay active.  Call Center for [facility] at [facility] at [phone] for an appointment.  They are located at [address], [place], [place], [address] (right across the hall from our office).  You will receive the Cologard kit in the mail.   Let us know if you need anything.

## 2023-03-05 ENCOUNTER — Other Ambulatory Visit (HOSPITAL_BASED_OUTPATIENT_CLINIC_OR_DEPARTMENT_OTHER): Payer: Self-pay

## 2023-03-21 ENCOUNTER — Other Ambulatory Visit (HOSPITAL_BASED_OUTPATIENT_CLINIC_OR_DEPARTMENT_OTHER): Payer: Self-pay

## 2023-03-21 ENCOUNTER — Other Ambulatory Visit: Payer: Self-pay

## 2023-03-23 ENCOUNTER — Other Ambulatory Visit: Payer: Self-pay

## 2023-03-23 DIAGNOSIS — G43009 Migraine without aura, not intractable, without status migrainosus: Secondary | ICD-10-CM

## 2023-03-23 MED ORDER — SUMATRIPTAN SUCCINATE 100 MG PO TABS
ORAL_TABLET | ORAL | 5 refills | Status: DC
Start: 2023-03-23 — End: 2023-07-12

## 2023-04-29 ENCOUNTER — Encounter (HOSPITAL_BASED_OUTPATIENT_CLINIC_OR_DEPARTMENT_OTHER): Payer: Self-pay | Admitting: Pharmacist

## 2023-04-29 ENCOUNTER — Other Ambulatory Visit (HOSPITAL_BASED_OUTPATIENT_CLINIC_OR_DEPARTMENT_OTHER): Payer: Self-pay

## 2023-05-02 ENCOUNTER — Other Ambulatory Visit (HOSPITAL_BASED_OUTPATIENT_CLINIC_OR_DEPARTMENT_OTHER): Payer: Self-pay

## 2023-05-02 ENCOUNTER — Other Ambulatory Visit: Payer: Self-pay

## 2023-05-10 ENCOUNTER — Other Ambulatory Visit (HOSPITAL_BASED_OUTPATIENT_CLINIC_OR_DEPARTMENT_OTHER): Payer: Self-pay

## 2023-05-11 ENCOUNTER — Other Ambulatory Visit: Payer: Self-pay

## 2023-05-11 ENCOUNTER — Other Ambulatory Visit (HOSPITAL_BASED_OUTPATIENT_CLINIC_OR_DEPARTMENT_OTHER): Payer: Self-pay

## 2023-07-09 ENCOUNTER — Other Ambulatory Visit: Payer: Self-pay | Admitting: Family Medicine

## 2023-07-09 ENCOUNTER — Other Ambulatory Visit (HOSPITAL_BASED_OUTPATIENT_CLINIC_OR_DEPARTMENT_OTHER): Payer: Self-pay

## 2023-07-09 ENCOUNTER — Other Ambulatory Visit: Payer: Self-pay

## 2023-07-09 DIAGNOSIS — M79671 Pain in right foot: Secondary | ICD-10-CM

## 2023-07-09 MED ORDER — MELOXICAM 15 MG PO TABS
15.0000 mg | ORAL_TABLET | Freq: Every day | ORAL | 2 refills | Status: DC
  Filled 2023-07-09: qty 30, 30d supply, fill #0
  Filled 2023-08-06: qty 30, 30d supply, fill #1
  Filled 2023-09-07 – 2023-09-18 (×3): qty 30, 30d supply, fill #2

## 2023-07-12 ENCOUNTER — Encounter: Payer: Self-pay | Admitting: Family Medicine

## 2023-07-12 ENCOUNTER — Other Ambulatory Visit (HOSPITAL_BASED_OUTPATIENT_CLINIC_OR_DEPARTMENT_OTHER): Payer: Self-pay

## 2023-07-12 DIAGNOSIS — G43009 Migraine without aura, not intractable, without status migrainosus: Secondary | ICD-10-CM

## 2023-07-12 MED ORDER — SUMATRIPTAN SUCCINATE 100 MG PO TABS
ORAL_TABLET | ORAL | 5 refills | Status: DC
  Filled 2023-07-12: qty 10, 30d supply, fill #0
  Filled 2023-08-06: qty 9, 30d supply, fill #1
  Filled 2023-08-29: qty 9, 30d supply, fill #2
  Filled 2023-09-07 – 2023-09-21 (×6): qty 9, 30d supply, fill #3
  Filled 2023-10-15: qty 9, 30d supply, fill #4
  Filled 2023-11-11 – 2023-11-14 (×2): qty 9, 30d supply, fill #5
  Filled 2023-12-10: qty 5, 5d supply, fill #6

## 2023-07-13 ENCOUNTER — Other Ambulatory Visit (HOSPITAL_BASED_OUTPATIENT_CLINIC_OR_DEPARTMENT_OTHER): Payer: Self-pay

## 2023-08-06 ENCOUNTER — Other Ambulatory Visit (HOSPITAL_BASED_OUTPATIENT_CLINIC_OR_DEPARTMENT_OTHER): Payer: Self-pay

## 2023-09-07 ENCOUNTER — Other Ambulatory Visit (HOSPITAL_COMMUNITY): Payer: Self-pay

## 2023-09-07 ENCOUNTER — Other Ambulatory Visit: Payer: Self-pay

## 2023-09-07 ENCOUNTER — Other Ambulatory Visit: Payer: Self-pay | Admitting: Family Medicine

## 2023-09-07 DIAGNOSIS — F411 Generalized anxiety disorder: Secondary | ICD-10-CM

## 2023-09-07 DIAGNOSIS — F325 Major depressive disorder, single episode, in full remission: Secondary | ICD-10-CM

## 2023-09-07 MED ORDER — QUETIAPINE FUMARATE 50 MG PO TABS
25.0000 mg | ORAL_TABLET | Freq: Every day | ORAL | 0 refills | Status: DC
Start: 2023-09-07 — End: 2023-11-11
  Filled 2023-09-07 – 2023-10-15 (×6): qty 30, 30d supply, fill #0

## 2023-09-12 ENCOUNTER — Other Ambulatory Visit (HOSPITAL_COMMUNITY): Payer: Self-pay

## 2023-09-12 ENCOUNTER — Other Ambulatory Visit: Payer: Self-pay

## 2023-09-13 ENCOUNTER — Encounter: Payer: Self-pay | Admitting: Pharmacist

## 2023-09-13 ENCOUNTER — Other Ambulatory Visit: Payer: Self-pay

## 2023-09-17 ENCOUNTER — Other Ambulatory Visit (HOSPITAL_BASED_OUTPATIENT_CLINIC_OR_DEPARTMENT_OTHER): Payer: Self-pay

## 2023-09-18 ENCOUNTER — Other Ambulatory Visit (HOSPITAL_COMMUNITY): Payer: Self-pay

## 2023-09-18 ENCOUNTER — Other Ambulatory Visit: Payer: Self-pay

## 2023-09-19 ENCOUNTER — Encounter (HOSPITAL_BASED_OUTPATIENT_CLINIC_OR_DEPARTMENT_OTHER): Payer: Self-pay

## 2023-09-19 ENCOUNTER — Other Ambulatory Visit (HOSPITAL_BASED_OUTPATIENT_CLINIC_OR_DEPARTMENT_OTHER): Payer: Self-pay

## 2023-09-21 ENCOUNTER — Other Ambulatory Visit (HOSPITAL_BASED_OUTPATIENT_CLINIC_OR_DEPARTMENT_OTHER): Payer: Self-pay

## 2023-09-21 ENCOUNTER — Other Ambulatory Visit: Payer: Self-pay

## 2023-10-15 ENCOUNTER — Other Ambulatory Visit: Payer: Self-pay | Admitting: Family Medicine

## 2023-10-15 ENCOUNTER — Other Ambulatory Visit (HOSPITAL_BASED_OUTPATIENT_CLINIC_OR_DEPARTMENT_OTHER): Payer: Self-pay

## 2023-10-15 DIAGNOSIS — M79672 Pain in left foot: Secondary | ICD-10-CM

## 2023-10-15 MED ORDER — MELOXICAM 15 MG PO TABS
15.0000 mg | ORAL_TABLET | Freq: Every day | ORAL | 2 refills | Status: DC
  Filled 2023-10-15: qty 30, 30d supply, fill #0
  Filled 2023-11-11: qty 30, 30d supply, fill #1
  Filled 2023-12-10: qty 30, 30d supply, fill #2

## 2023-11-11 ENCOUNTER — Other Ambulatory Visit: Payer: Self-pay | Admitting: Family Medicine

## 2023-11-11 DIAGNOSIS — F325 Major depressive disorder, single episode, in full remission: Secondary | ICD-10-CM

## 2023-11-11 DIAGNOSIS — F411 Generalized anxiety disorder: Secondary | ICD-10-CM

## 2023-11-11 DIAGNOSIS — G43009 Migraine without aura, not intractable, without status migrainosus: Secondary | ICD-10-CM

## 2023-11-12 ENCOUNTER — Other Ambulatory Visit: Payer: Self-pay

## 2023-11-12 ENCOUNTER — Other Ambulatory Visit (HOSPITAL_BASED_OUTPATIENT_CLINIC_OR_DEPARTMENT_OTHER): Payer: Self-pay

## 2023-11-12 MED ORDER — QUETIAPINE FUMARATE 50 MG PO TABS
25.0000 mg | ORAL_TABLET | Freq: Every day | ORAL | 0 refills | Status: DC
Start: 2023-11-12 — End: 2023-12-10
  Filled 2023-11-12: qty 30, 30d supply, fill #0

## 2023-11-12 MED ORDER — DIVALPROEX SODIUM ER 500 MG PO TB24
500.0000 mg | ORAL_TABLET | Freq: Every day | ORAL | 6 refills | Status: DC
  Filled 2023-11-12: qty 30, 30d supply, fill #0
  Filled 2023-12-10: qty 30, 30d supply, fill #1
  Filled 2024-01-12: qty 30, 30d supply, fill #2
  Filled 2024-02-11: qty 30, 30d supply, fill #3
  Filled 2024-03-15: qty 30, 30d supply, fill #4
  Filled 2024-04-12: qty 30, 30d supply, fill #5
  Filled 2024-05-05: qty 30, 30d supply, fill #6

## 2023-12-10 ENCOUNTER — Other Ambulatory Visit (HOSPITAL_BASED_OUTPATIENT_CLINIC_OR_DEPARTMENT_OTHER): Payer: Self-pay

## 2023-12-10 ENCOUNTER — Other Ambulatory Visit: Payer: Self-pay | Admitting: Family Medicine

## 2023-12-10 DIAGNOSIS — F325 Major depressive disorder, single episode, in full remission: Secondary | ICD-10-CM

## 2023-12-10 DIAGNOSIS — G43009 Migraine without aura, not intractable, without status migrainosus: Secondary | ICD-10-CM

## 2023-12-10 DIAGNOSIS — F411 Generalized anxiety disorder: Secondary | ICD-10-CM

## 2023-12-11 ENCOUNTER — Other Ambulatory Visit (HOSPITAL_BASED_OUTPATIENT_CLINIC_OR_DEPARTMENT_OTHER): Payer: Self-pay

## 2023-12-11 ENCOUNTER — Encounter (HOSPITAL_BASED_OUTPATIENT_CLINIC_OR_DEPARTMENT_OTHER): Payer: Self-pay

## 2023-12-11 ENCOUNTER — Other Ambulatory Visit: Payer: Self-pay

## 2023-12-11 MED ORDER — SUMATRIPTAN SUCCINATE 100 MG PO TABS
ORAL_TABLET | ORAL | 5 refills | Status: DC
  Filled 2023-12-11: qty 9, 30d supply, fill #0
  Filled 2023-12-11: qty 10, 30d supply, fill #0
  Filled 2024-01-01: qty 10, 30d supply, fill #1
  Filled 2024-01-21: qty 10, 30d supply, fill #2
  Filled 2024-02-11: qty 10, 30d supply, fill #3
  Filled 2024-03-15: qty 9, 30d supply, fill #4
  Filled 2024-04-12: qty 9, 30d supply, fill #5
  Filled 2024-05-05: qty 2, 2d supply, fill #6

## 2023-12-11 MED ORDER — QUETIAPINE FUMARATE 50 MG PO TABS
25.0000 mg | ORAL_TABLET | Freq: Every day | ORAL | 0 refills | Status: DC
Start: 2023-12-11 — End: 2024-01-12
  Filled 2023-12-11: qty 30, 30d supply, fill #0

## 2023-12-13 ENCOUNTER — Other Ambulatory Visit (HOSPITAL_BASED_OUTPATIENT_CLINIC_OR_DEPARTMENT_OTHER): Payer: Self-pay

## 2024-01-01 ENCOUNTER — Other Ambulatory Visit (HOSPITAL_BASED_OUTPATIENT_CLINIC_OR_DEPARTMENT_OTHER): Payer: Self-pay

## 2024-01-12 ENCOUNTER — Other Ambulatory Visit: Payer: Self-pay | Admitting: Family Medicine

## 2024-01-12 DIAGNOSIS — F325 Major depressive disorder, single episode, in full remission: Secondary | ICD-10-CM

## 2024-01-12 DIAGNOSIS — F411 Generalized anxiety disorder: Secondary | ICD-10-CM

## 2024-01-12 DIAGNOSIS — M79671 Pain in right foot: Secondary | ICD-10-CM

## 2024-01-13 ENCOUNTER — Other Ambulatory Visit: Payer: Self-pay

## 2024-01-15 ENCOUNTER — Other Ambulatory Visit (HOSPITAL_BASED_OUTPATIENT_CLINIC_OR_DEPARTMENT_OTHER): Payer: Self-pay

## 2024-01-15 MED ORDER — QUETIAPINE FUMARATE 50 MG PO TABS
25.0000 mg | ORAL_TABLET | Freq: Every day | ORAL | 0 refills | Status: DC
Start: 2024-01-15 — End: 2024-02-12
  Filled 2024-01-15: qty 30, 30d supply, fill #0

## 2024-01-15 MED ORDER — FLUOXETINE HCL 40 MG PO CAPS
40.0000 mg | ORAL_CAPSULE | Freq: Every day | ORAL | 6 refills | Status: DC
  Filled 2024-01-15: qty 30, 30d supply, fill #0
  Filled 2024-02-11: qty 30, 30d supply, fill #1
  Filled 2024-03-15: qty 30, 30d supply, fill #2
  Filled 2024-04-12: qty 30, 30d supply, fill #3
  Filled 2024-05-05: qty 30, 30d supply, fill #4
  Filled 2024-06-09: qty 30, 30d supply, fill #5
  Filled 2024-07-05: qty 30, 30d supply, fill #6

## 2024-01-15 MED ORDER — MELOXICAM 15 MG PO TABS
15.0000 mg | ORAL_TABLET | Freq: Every day | ORAL | 2 refills | Status: DC
  Filled 2024-01-15: qty 30, 30d supply, fill #0
  Filled 2024-02-11: qty 30, 30d supply, fill #1
  Filled 2024-03-15: qty 30, 30d supply, fill #2

## 2024-01-21 ENCOUNTER — Other Ambulatory Visit (HOSPITAL_BASED_OUTPATIENT_CLINIC_OR_DEPARTMENT_OTHER): Payer: Self-pay

## 2024-02-11 ENCOUNTER — Other Ambulatory Visit (HOSPITAL_BASED_OUTPATIENT_CLINIC_OR_DEPARTMENT_OTHER): Payer: Self-pay

## 2024-02-11 ENCOUNTER — Other Ambulatory Visit: Payer: Self-pay | Admitting: Family Medicine

## 2024-02-11 ENCOUNTER — Other Ambulatory Visit: Payer: Self-pay

## 2024-02-11 DIAGNOSIS — F411 Generalized anxiety disorder: Secondary | ICD-10-CM

## 2024-02-11 DIAGNOSIS — F325 Major depressive disorder, single episode, in full remission: Secondary | ICD-10-CM

## 2024-02-12 ENCOUNTER — Telehealth (INDEPENDENT_AMBULATORY_CARE_PROVIDER_SITE_OTHER): Admitting: Family Medicine

## 2024-02-12 ENCOUNTER — Other Ambulatory Visit (HOSPITAL_BASED_OUTPATIENT_CLINIC_OR_DEPARTMENT_OTHER): Payer: Self-pay

## 2024-02-12 ENCOUNTER — Encounter: Payer: Self-pay | Admitting: Family Medicine

## 2024-02-12 DIAGNOSIS — E782 Mixed hyperlipidemia: Secondary | ICD-10-CM | POA: Diagnosis not present

## 2024-02-12 DIAGNOSIS — F411 Generalized anxiety disorder: Secondary | ICD-10-CM

## 2024-02-12 DIAGNOSIS — Z1211 Encounter for screening for malignant neoplasm of colon: Secondary | ICD-10-CM

## 2024-02-12 DIAGNOSIS — G43009 Migraine without aura, not intractable, without status migrainosus: Secondary | ICD-10-CM | POA: Diagnosis not present

## 2024-02-12 DIAGNOSIS — F325 Major depressive disorder, single episode, in full remission: Secondary | ICD-10-CM | POA: Diagnosis not present

## 2024-02-12 MED ORDER — QUETIAPINE FUMARATE 50 MG PO TABS
25.0000 mg | ORAL_TABLET | Freq: Every day | ORAL | 5 refills | Status: DC
  Filled 2024-02-12: qty 30, 30d supply, fill #0
  Filled 2024-03-15: qty 30, 30d supply, fill #1
  Filled 2024-04-12: qty 30, 30d supply, fill #2
  Filled 2024-05-05: qty 30, 30d supply, fill #3
  Filled 2024-06-09: qty 30, 30d supply, fill #4
  Filled 2024-07-05: qty 30, 30d supply, fill #5

## 2024-02-12 NOTE — Progress Notes (Signed)
 CC: f/u  Subjective Cindy Conway presents for f/u anxiety/depression. We are interacting via web portal for an electronic face-to-face visit. I verified patient's ID using 2 identifiers. Patient agreed to proceed with visit via this method. Patient is at home, I am at office. Patient and I are present for visit.   Pt is currently being treated with Seroquel  25 to 50 mg nightly, Prozac  40 mg daily.  Reports stability since treatment. No thoughts of harming self or others. No self-medication with alcohol, prescription drugs or illicit drugs. Pt is not following with a counselor/psychologist.  Hyperlipidemia Patient presents for dyslipidemia follow up. Currently being treated with Lipitor 40 mg daily and compliance with treatment thus far has been good. She denies myalgias. She is sometimes adhering to a healthy diet. Exercise: None, does take the stairs at her apartment No chest pain or shortness of breath The patient is not known to have coexisting coronary artery disease.  Patient has a history of chronic migraines.  She takes Depakote  ER 500 mg daily for prophylaxis.  She reports compliance no adverse effects.  Headaches are infrequent.  She does take Imitrex  100 mg daily as needed.  It does work well for her and she has no adverse effects with it.  Past Medical History:  Diagnosis Date   Anxiety    Depression    Migraines    Allergies as of 02/12/2024       Reactions   Sulfa Antibiotics Rash        Medication List        Accurate as of February 12, 2024 12:40 PM. If you have any questions, ask your nurse or doctor.          STOP taking these medications    gabapentin  600 MG tablet Commonly known as: NEURONTIN  Stopped by: Mabel [place] Denardo       TAKE these medications    atorvastatin  40 MG tablet Commonly known as: LIPITOR TAKE 1 TABLET(40 MG) BY MOUTH DAILY   divalproex  500 MG 24 hr tablet Commonly known as: DEPAKOTE  ER Take 1 tablet (500 mg total)  by mouth daily.   FLUoxetine  40 MG capsule Commonly known as: PROZAC  Take 1 capsule (40 mg total) by mouth daily.   meloxicam  15 MG tablet Commonly known as: MOBIC  Take 1 tablet (15 mg total) by mouth daily.   QUEtiapine  50 MG tablet Commonly known as: SEROquel  Take 0.5-1 tablets (25-50 mg total) by mouth at bedtime.   SUMAtriptan  100 MG tablet Commonly known as: IMITREX  TAKE 1 TABLET BY MOUTH ONCE, MAY REPEAT IN 2 HOURS IF HEADACHE PERSISTS        Exam No conversational dyspnea Age appropriate judgment and insight Nml affect and mood  Assessment and Plan  Depression, major, single episode, complete remission (HCC) - Plan: QUEtiapine  (SEROQUEL ) 50 MG tablet  GAD (generalized anxiety disorder) - Plan: QUEtiapine  (SEROQUEL ) 50 MG tablet  Mixed hyperlipidemia  Migraine without aura and without status migrainosus, not intractable  Colon cancer screening - Plan: Cologuard  1/2.  Chronic, stable.  Continue quetiapine  25 to 50 mg nightly, Prozac  40 mg daily. 3.  Chronic, stable.  Continue Lipitor 40 mg daily.  Counseled on diet and exercise. 4.  Chronic, stable.  Continue Depakote  ER 500 mg daily, Imitrex  100 mg daily as needed. 5.  Reorder Cologuard. GYN info provided via [facility] message.  She will set up her mammogram through work. F/u in 6 mo. The patient voiced understanding and agreement to the plan.  Mabel [place], DO 02/12/24 12:40 PM

## 2024-03-15 ENCOUNTER — Other Ambulatory Visit: Payer: Self-pay | Admitting: Family Medicine

## 2024-03-15 DIAGNOSIS — E782 Mixed hyperlipidemia: Secondary | ICD-10-CM

## 2024-03-17 ENCOUNTER — Other Ambulatory Visit (HOSPITAL_BASED_OUTPATIENT_CLINIC_OR_DEPARTMENT_OTHER): Payer: Self-pay

## 2024-03-17 MED ORDER — ATORVASTATIN CALCIUM 40 MG PO TABS
40.0000 mg | ORAL_TABLET | Freq: Every day | ORAL | 11 refills | Status: AC
  Filled 2024-03-17: qty 30, 30d supply, fill #0
  Filled 2024-04-12: qty 30, 30d supply, fill #1
  Filled 2024-05-05: qty 30, 30d supply, fill #2
  Filled 2024-06-09: qty 30, 30d supply, fill #3
  Filled 2024-07-06: qty 30, 30d supply, fill #4
  Filled 2024-08-06: qty 30, 30d supply, fill #5
  Filled 2024-09-02: qty 30, 30d supply, fill #6

## 2024-04-12 ENCOUNTER — Other Ambulatory Visit: Payer: Self-pay | Admitting: Family Medicine

## 2024-04-12 DIAGNOSIS — M79671 Pain in right foot: Secondary | ICD-10-CM

## 2024-04-14 ENCOUNTER — Other Ambulatory Visit (HOSPITAL_BASED_OUTPATIENT_CLINIC_OR_DEPARTMENT_OTHER): Payer: Self-pay

## 2024-04-14 ENCOUNTER — Other Ambulatory Visit: Payer: Self-pay

## 2024-04-14 MED ORDER — MELOXICAM 15 MG PO TABS
15.0000 mg | ORAL_TABLET | Freq: Every day | ORAL | 2 refills | Status: DC
  Filled 2024-04-14: qty 30, 30d supply, fill #0
  Filled 2024-05-05 – 2024-05-07 (×2): qty 30, 30d supply, fill #1
  Filled 2024-06-09: qty 30, 30d supply, fill #2

## 2024-05-05 ENCOUNTER — Other Ambulatory Visit: Payer: Self-pay | Admitting: Family Medicine

## 2024-05-05 ENCOUNTER — Other Ambulatory Visit (HOSPITAL_BASED_OUTPATIENT_CLINIC_OR_DEPARTMENT_OTHER): Payer: Self-pay

## 2024-05-05 DIAGNOSIS — G43009 Migraine without aura, not intractable, without status migrainosus: Secondary | ICD-10-CM

## 2024-05-05 MED ORDER — SUMATRIPTAN SUCCINATE 100 MG PO TABS
ORAL_TABLET | ORAL | 2 refills | Status: DC
  Filled 2024-05-05: qty 9, 21d supply, fill #0
  Filled 2024-06-09: qty 9, 21d supply, fill #1
  Filled 2024-07-05: qty 9, 21d supply, fill #2

## 2024-05-09 ENCOUNTER — Encounter (HOSPITAL_BASED_OUTPATIENT_CLINIC_OR_DEPARTMENT_OTHER): Payer: Self-pay

## 2024-05-09 ENCOUNTER — Other Ambulatory Visit (HOSPITAL_BASED_OUTPATIENT_CLINIC_OR_DEPARTMENT_OTHER): Payer: Self-pay

## 2024-06-09 ENCOUNTER — Other Ambulatory Visit: Payer: Self-pay

## 2024-06-09 ENCOUNTER — Other Ambulatory Visit: Payer: Self-pay | Admitting: Family Medicine

## 2024-06-09 ENCOUNTER — Other Ambulatory Visit (HOSPITAL_BASED_OUTPATIENT_CLINIC_OR_DEPARTMENT_OTHER): Payer: Self-pay

## 2024-06-09 DIAGNOSIS — G43009 Migraine without aura, not intractable, without status migrainosus: Secondary | ICD-10-CM

## 2024-06-09 MED ORDER — DIVALPROEX SODIUM ER 500 MG PO TB24
500.0000 mg | ORAL_TABLET | Freq: Every day | ORAL | 6 refills | Status: AC
  Filled 2024-06-09: qty 30, 30d supply, fill #0
  Filled 2024-07-06: qty 30, 30d supply, fill #1
  Filled 2024-08-06: qty 30, 30d supply, fill #2
  Filled 2024-09-02: qty 30, 30d supply, fill #3

## 2024-07-06 ENCOUNTER — Other Ambulatory Visit: Payer: Self-pay | Admitting: Family Medicine

## 2024-07-06 DIAGNOSIS — M79671 Pain in right foot: Secondary | ICD-10-CM

## 2024-07-07 ENCOUNTER — Other Ambulatory Visit: Payer: Self-pay

## 2024-07-07 ENCOUNTER — Other Ambulatory Visit (HOSPITAL_BASED_OUTPATIENT_CLINIC_OR_DEPARTMENT_OTHER): Payer: Self-pay

## 2024-07-07 MED ORDER — MELOXICAM 15 MG PO TABS
15.0000 mg | ORAL_TABLET | Freq: Every day | ORAL | 2 refills | Status: AC
  Filled 2024-07-07: qty 30, 30d supply, fill #0
  Filled 2024-08-06: qty 30, 30d supply, fill #1
  Filled 2024-09-02: qty 30, 30d supply, fill #2

## 2024-07-23 ENCOUNTER — Other Ambulatory Visit: Payer: Self-pay | Admitting: Family Medicine

## 2024-07-23 DIAGNOSIS — G43009 Migraine without aura, not intractable, without status migrainosus: Secondary | ICD-10-CM

## 2024-07-24 ENCOUNTER — Other Ambulatory Visit (HOSPITAL_BASED_OUTPATIENT_CLINIC_OR_DEPARTMENT_OTHER): Payer: Self-pay

## 2024-07-24 MED ORDER — SUMATRIPTAN SUCCINATE 100 MG PO TABS
ORAL_TABLET | ORAL | 1 refills | Status: DC
  Filled 2024-07-24: qty 9, 30d supply, fill #0
  Filled 2024-08-06 – 2024-08-09 (×2): qty 9, 30d supply, fill #1

## 2024-08-06 ENCOUNTER — Other Ambulatory Visit: Payer: Self-pay

## 2024-08-06 ENCOUNTER — Other Ambulatory Visit: Payer: Self-pay | Admitting: Family Medicine

## 2024-08-06 ENCOUNTER — Other Ambulatory Visit (HOSPITAL_BASED_OUTPATIENT_CLINIC_OR_DEPARTMENT_OTHER): Payer: Self-pay

## 2024-08-06 DIAGNOSIS — F411 Generalized anxiety disorder: Secondary | ICD-10-CM

## 2024-08-06 DIAGNOSIS — F325 Major depressive disorder, single episode, in full remission: Secondary | ICD-10-CM

## 2024-08-06 MED ORDER — QUETIAPINE FUMARATE 50 MG PO TABS
25.0000 mg | ORAL_TABLET | Freq: Every day | ORAL | 0 refills | Status: DC
  Filled 2024-08-06: qty 30, 30d supply, fill #0

## 2024-08-06 MED ORDER — FLUOXETINE HCL 40 MG PO CAPS
40.0000 mg | ORAL_CAPSULE | Freq: Every day | ORAL | 0 refills | Status: DC
  Filled 2024-08-06: qty 30, 30d supply, fill #0

## 2024-08-10 ENCOUNTER — Other Ambulatory Visit (HOSPITAL_BASED_OUTPATIENT_CLINIC_OR_DEPARTMENT_OTHER): Payer: Self-pay

## 2024-08-11 ENCOUNTER — Other Ambulatory Visit (HOSPITAL_BASED_OUTPATIENT_CLINIC_OR_DEPARTMENT_OTHER): Payer: Self-pay

## 2024-09-02 ENCOUNTER — Other Ambulatory Visit: Payer: Self-pay

## 2024-09-02 ENCOUNTER — Other Ambulatory Visit: Payer: Self-pay | Admitting: Family Medicine

## 2024-09-02 ENCOUNTER — Other Ambulatory Visit (HOSPITAL_BASED_OUTPATIENT_CLINIC_OR_DEPARTMENT_OTHER): Payer: Self-pay

## 2024-09-02 ENCOUNTER — Encounter: Payer: Self-pay | Admitting: Family Medicine

## 2024-09-02 DIAGNOSIS — F411 Generalized anxiety disorder: Secondary | ICD-10-CM

## 2024-09-02 DIAGNOSIS — G43009 Migraine without aura, not intractable, without status migrainosus: Secondary | ICD-10-CM

## 2024-09-02 DIAGNOSIS — F325 Major depressive disorder, single episode, in full remission: Secondary | ICD-10-CM

## 2024-09-02 MED ORDER — SUMATRIPTAN SUCCINATE 100 MG PO TABS
ORAL_TABLET | ORAL | 1 refills | Status: AC
  Filled 2024-09-02: qty 9, 30d supply, fill #0
  Filled 2024-09-24 – 2024-09-25 (×3): qty 9, 30d supply, fill #1

## 2024-09-02 MED ORDER — QUETIAPINE FUMARATE 50 MG PO TABS
25.0000 mg | ORAL_TABLET | Freq: Every day | ORAL | 0 refills | Status: AC
  Filled 2024-09-02: qty 30, 30d supply, fill #0

## 2024-09-02 MED ORDER — FLUOXETINE HCL 40 MG PO CAPS
40.0000 mg | ORAL_CAPSULE | Freq: Every day | ORAL | 0 refills | Status: AC
  Filled 2024-09-02: qty 30, 30d supply, fill #0

## 2024-09-10 ENCOUNTER — Encounter: Admitting: Family Medicine

## 2024-09-12 ENCOUNTER — Encounter: Admitting: Family Medicine

## 2024-09-19 ENCOUNTER — Encounter: Admitting: Family Medicine

## 2024-09-24 ENCOUNTER — Other Ambulatory Visit (HOSPITAL_BASED_OUTPATIENT_CLINIC_OR_DEPARTMENT_OTHER): Payer: Self-pay

## 2024-09-25 ENCOUNTER — Other Ambulatory Visit (HOSPITAL_BASED_OUTPATIENT_CLINIC_OR_DEPARTMENT_OTHER): Payer: Self-pay

## 2024-09-25 ENCOUNTER — Encounter (HOSPITAL_BASED_OUTPATIENT_CLINIC_OR_DEPARTMENT_OTHER): Payer: Self-pay

## 2024-09-26 ENCOUNTER — Encounter: Admitting: Family Medicine

## 2024-10-03 ENCOUNTER — Encounter: Admitting: Family Medicine
